# Patient Record
Sex: Female | Born: 1935 | Race: Black or African American | Hispanic: No | Smoking: Former smoker
Health system: Southern US, Community
[De-identification: ages and names within clinical notes are randomized; demographics above are authoritative.]

## PROBLEM LIST (undated history)

## (undated) DIAGNOSIS — M810 Age-related osteoporosis without current pathological fracture: Secondary | ICD-10-CM

## (undated) DIAGNOSIS — B019 Varicella without complication: Secondary | ICD-10-CM

## (undated) DIAGNOSIS — Z8679 Personal history of other diseases of the circulatory system: Secondary | ICD-10-CM

## (undated) DIAGNOSIS — M1990 Unspecified osteoarthritis, unspecified site: Secondary | ICD-10-CM

## (undated) DIAGNOSIS — N189 Chronic kidney disease, unspecified: Secondary | ICD-10-CM

## (undated) DIAGNOSIS — I10 Essential (primary) hypertension: Secondary | ICD-10-CM

## (undated) DIAGNOSIS — J8410 Pulmonary fibrosis, unspecified: Secondary | ICD-10-CM

## (undated) HISTORY — PX: BREAST SURGERY: SHX581

## (undated) HISTORY — DX: Chronic kidney disease, unspecified: N18.9

## (undated) HISTORY — DX: Personal history of other diseases of the circulatory system: Z86.79

## (undated) HISTORY — PX: TOTAL HIP ARTHROPLASTY: SHX124

## (undated) HISTORY — DX: Unspecified osteoarthritis, unspecified site: M19.90

## (undated) HISTORY — DX: Varicella without complication: B01.9

---

## 2016-01-02 ENCOUNTER — Emergency Department: Payer: Medicare Other

## 2016-01-02 ENCOUNTER — Inpatient Hospital Stay
Admission: EM | Admit: 2016-01-02 | Discharge: 2016-01-04 | DRG: 190 | Disposition: A | Discharge status: Home Health | Payer: Medicare Other | Attending: Internal Medicine | Admitting: Internal Medicine

## 2016-01-02 ENCOUNTER — Inpatient Hospital Stay: Payer: Medicare Other

## 2016-01-02 ENCOUNTER — Encounter: Payer: Self-pay | Admitting: Emergency Medicine

## 2016-01-02 DIAGNOSIS — Z7982 Long term (current) use of aspirin: Secondary | ICD-10-CM | POA: Diagnosis not present

## 2016-01-02 DIAGNOSIS — J9621 Acute and chronic respiratory failure with hypoxia: Secondary | ICD-10-CM | POA: Diagnosis present

## 2016-01-02 DIAGNOSIS — J9620 Acute and chronic respiratory failure, unspecified whether with hypoxia or hypercapnia: Secondary | ICD-10-CM | POA: Diagnosis present

## 2016-01-02 DIAGNOSIS — J440 Chronic obstructive pulmonary disease with acute lower respiratory infection: Principal | ICD-10-CM | POA: Diagnosis present

## 2016-01-02 DIAGNOSIS — E876 Hypokalemia: Secondary | ICD-10-CM | POA: Diagnosis present

## 2016-01-02 DIAGNOSIS — J189 Pneumonia, unspecified organism: Secondary | ICD-10-CM

## 2016-01-02 DIAGNOSIS — Z87891 Personal history of nicotine dependence: Secondary | ICD-10-CM | POA: Diagnosis not present

## 2016-01-02 DIAGNOSIS — M810 Age-related osteoporosis without current pathological fracture: Secondary | ICD-10-CM | POA: Diagnosis present

## 2016-01-02 DIAGNOSIS — Z79899 Other long term (current) drug therapy: Secondary | ICD-10-CM | POA: Diagnosis not present

## 2016-01-02 DIAGNOSIS — J180 Bronchopneumonia, unspecified organism: Secondary | ICD-10-CM | POA: Diagnosis present

## 2016-01-02 DIAGNOSIS — J8410 Pulmonary fibrosis, unspecified: Secondary | ICD-10-CM | POA: Diagnosis present

## 2016-01-02 DIAGNOSIS — I10 Essential (primary) hypertension: Secondary | ICD-10-CM | POA: Diagnosis present

## 2016-01-02 DIAGNOSIS — R0902 Hypoxemia: Secondary | ICD-10-CM

## 2016-01-02 HISTORY — DX: Age-related osteoporosis without current pathological fracture: M81.0

## 2016-01-02 HISTORY — DX: Essential (primary) hypertension: I10

## 2016-01-02 HISTORY — DX: Pulmonary fibrosis, unspecified: J84.10

## 2016-01-02 LAB — BASIC METABOLIC PANEL
Anion gap: 9 (ref 5–15)
BUN: 25 mg/dL — AB (ref 6–20)
CHLORIDE: 103 mmol/L (ref 101–111)
CO2: 25 mmol/L (ref 22–32)
Calcium: 10.1 mg/dL (ref 8.9–10.3)
Creatinine, Ser: 1.16 mg/dL — ABNORMAL HIGH (ref 0.44–1.00)
GFR calc Af Amer: 51 mL/min — ABNORMAL LOW (ref >60)
GFR calc non Af Amer: 44 mL/min — ABNORMAL LOW (ref >60)
GLUCOSE: 95 mg/dL (ref 65–99)
POTASSIUM: 3.4 mmol/L — AB (ref 3.5–5.1)
Sodium: 137 mmol/L (ref 135–145)

## 2016-01-02 LAB — CBC
HEMATOCRIT: 40.9 % (ref 35.0–47.0)
HEMOGLOBIN: 13.7 g/dL (ref 12.0–16.0)
MCH: 27.6 pg (ref 26.0–34.0)
MCHC: 33.5 g/dL (ref 32.0–36.0)
MCV: 82.6 fL (ref 80.0–100.0)
Platelets: 321 10*3/uL (ref 150–440)
RBC: 4.96 MIL/uL (ref 3.80–5.20)
RDW: 15.3 % — ABNORMAL HIGH (ref 11.5–14.5)
WBC: 8.2 10*3/uL (ref 3.6–11.0)

## 2016-01-02 LAB — TROPONIN I: Troponin I: <0.03 ng/mL (ref <0.03)

## 2016-01-02 LAB — BRAIN NATRIURETIC PEPTIDE: B Natriuretic Peptide: 126 pg/mL — ABNORMAL HIGH (ref 0.0–100.0)

## 2016-01-02 LAB — MAGNESIUM: MAGNESIUM: 1.6 mg/dL — AB (ref 1.7–2.4)

## 2016-01-02 MED ORDER — ONDANSETRON HCL 4 MG/2ML IJ SOLN: 4.0000 mg | Freq: Four times a day (QID) | INTRAMUSCULAR | Status: DC | PRN

## 2016-01-02 MED ORDER — ENOXAPARIN SODIUM 40 MG/0.4ML ~~LOC~~ SOLN: 40.0000 mg | SUBCUTANEOUS | Status: DC

## 2016-01-02 MED ORDER — ACETAMINOPHEN 650 MG RE SUPP: 650.0000 mg | Freq: Four times a day (QID) | RECTAL | Status: DC | PRN

## 2016-01-02 MED ORDER — ACETAMINOPHEN 500 MG PO TABS
1000.0000 mg | ORAL_TABLET | Freq: Once | ORAL | Status: AC
  Administered 2016-01-02: 1000 mg via ORAL

## 2016-01-02 MED ORDER — IPRATROPIUM-ALBUTEROL 0.5-2.5 (3) MG/3ML IN SOLN
3.0000 mL | Freq: Once | RESPIRATORY_TRACT | Status: AC
  Administered 2016-01-02: 3 mL via RESPIRATORY_TRACT
  Filled 2016-01-02: qty 3

## 2016-01-02 MED ORDER — POTASSIUM CHLORIDE CRYS ER 20 MEQ PO TBCR
20.0000 meq | EXTENDED_RELEASE_TABLET | Freq: Two times a day (BID) | ORAL | Status: DC
  Administered 2016-01-02: 22:00:00 20 meq via ORAL
  Filled 2016-01-02: qty 1

## 2016-01-02 MED ORDER — ACETAMINOPHEN 500 MG PO TABS
ORAL_TABLET | ORAL | Status: AC
  Administered 2016-01-02: 1000 mg via ORAL
  Filled 2016-01-02: qty 2

## 2016-01-02 MED ORDER — METHYLPREDNISOLONE SODIUM SUCC 125 MG IJ SOLR
62.5000 mg | Freq: Once | INTRAMUSCULAR | Status: AC
  Administered 2016-01-02: 62.5 mg via INTRAVENOUS
  Filled 2016-01-02: qty 2

## 2016-01-02 MED ORDER — LEVOFLOXACIN IN D5W 750 MG/150ML IV SOLN
750.0000 mg | Freq: Once | INTRAVENOUS | Status: AC
  Administered 2016-01-02: 750 mg via INTRAVENOUS
  Filled 2016-01-02: qty 150

## 2016-01-02 MED ORDER — ENOXAPARIN SODIUM 30 MG/0.3ML ~~LOC~~ SOLN
30.0000 mg | SUBCUTANEOUS | Status: DC
  Administered 2016-01-02 – 2016-01-03 (×2): 30 mg via SUBCUTANEOUS
  Filled 2016-01-02 (×2): qty 0.3

## 2016-01-02 MED ORDER — IPRATROPIUM-ALBUTEROL 0.5-2.5 (3) MG/3ML IN SOLN
RESPIRATORY_TRACT | Status: AC
  Administered 2016-01-02: 20:00:00 3 mL
  Filled 2016-01-02: qty 3

## 2016-01-02 MED ORDER — IPRATROPIUM-ALBUTEROL 0.5-2.5 (3) MG/3ML IN SOLN
3.0000 mL | Freq: Four times a day (QID) | RESPIRATORY_TRACT | Status: DC
  Administered 2016-01-03 – 2016-01-04 (×6): 3 mL via RESPIRATORY_TRACT
  Filled 2016-01-02 (×6): qty 3

## 2016-01-02 MED ORDER — METHYLPREDNISOLONE SODIUM SUCC 40 MG IJ SOLR
40.0000 mg | Freq: Two times a day (BID) | INTRAMUSCULAR | Status: DC
  Administered 2016-01-03 – 2016-01-04 (×3): 40 mg via INTRAVENOUS
  Filled 2016-01-02 (×3): qty 1

## 2016-01-02 MED ORDER — ONDANSETRON HCL 4 MG PO TABS: 4.0000 mg | ORAL_TABLET | Freq: Four times a day (QID) | ORAL | Status: DC | PRN

## 2016-01-02 MED ORDER — ASPIRIN EC 81 MG PO TBEC
81.0000 mg | DELAYED_RELEASE_TABLET | Freq: Every day | ORAL | Status: DC
  Administered 2016-01-02 – 2016-01-04 (×3): 81 mg via ORAL
  Filled 2016-01-02 (×3): qty 1

## 2016-01-02 MED ORDER — LEVOFLOXACIN IN D5W 750 MG/150ML IV SOLN: 750.0000 mg | INTRAVENOUS | Status: DC

## 2016-01-02 MED ORDER — ACETAMINOPHEN 325 MG PO TABS
650.0000 mg | ORAL_TABLET | Freq: Four times a day (QID) | ORAL | Status: DC | PRN
  Administered 2016-01-02 – 2016-01-04 (×4): 650 mg via ORAL
  Filled 2016-01-02 (×4): qty 2

## 2016-01-02 NOTE — Progress Notes (Signed)
Anticoagulation monitoring(Lovenox):  81 yo  ordered Lovenox 40 mg Q24h  Filed Weights   01/02/16 1544  Weight: 102 lb (46.267 kg)   Per pt thru RN, pt ht 5' CrCl ~ 29 ml/min  Lab Results  Component Value Date   CREATININE 1.16* 01/02/2016   CrCl cannot be calculated (Unknown ideal weight.). Hemoglobin & Hematocrit     Component Value Date/Time   HGB 13.7 01/02/2016 1549   HCT 40.9 01/02/2016 1549     Per Protocol for Patient with estCrcl< 30 ml/min and BMI < 40, will transition to Lovenox 30 mg Q24h.

## 2016-01-02 NOTE — Progress Notes (Signed)
Received a newly admitted Patient from ED, came to the unit per stretcher, escorted by ED NT. Patient alert and coherent, not in any form of distress. Came in due to Shortness of breath, with an admitting diagnosis of COPD, Bronchopneumonia. Routine admission care provided. Safety measures in place. Initial assessment completed.  Orientation to room done.

## 2016-01-02 NOTE — H&P (Signed)
[facility] [place] [facility]   PATIENT NAME: Rosana Bergerheresa Saunders    MR#:  [id number]  DATE OF BIRTH:  27-Jul-1934  DATE OF ADMISSION:  01/02/2016  PRIMARY CARE PHYSICIAN: No PCP Per Patient Non-local  REQUESTING/REFERRING PHYSICIAN: Dr. Minna AntisKevin Paduchowski  CHIEF COMPLAINT:   Chief Complaint  Patient presents with  . Shortness of Breath  . Chest Pain    HISTORY OF PRESENT ILLNESS:  Rosana Bergerheresa Saunders  is a 81 y.o. female with a known history of COPD, hypertension, osteoporosis, pulmonary fibrosis who presents to the hospital due to shortness of breath. Patient just moved from [place] a few weeks back and has not established care with a physician here in this area. She has been feeling short of breath now for the past to 3 weeks progressively getting worse. She is not on oxygen at home. She admits to a cough which is productive of yellow sputum but denies any fevers, chills, nausea, vomiting, abdominal pain or any other associated symptoms. She presented to the emergency room was noted to be in acute respiratory failure with hypoxia and hospitalist services were contacted further treatment and evaluation.  PAST MEDICAL HISTORY:   Past Medical History  Diagnosis Date  . COPD (chronic obstructive pulmonary disease) (HCC)   . Hypertension   . Osteoporosis   . Renal disorder     PAST SURGICAL HISTORY:   Past Surgical History  Procedure Laterality Date  . Breast surgery    . Total hip arthroplasty      SOCIAL HISTORY:   Social History  Substance Use Topics  . Smoking status: Former Games developermoker  . Smokeless tobacco: Not on file  . Alcohol Use: No    FAMILY HISTORY:  No family history on file.  DRUG ALLERGIES:  No Known Allergies  REVIEW OF SYSTEMS:   Review of Systems  Constitutional: Negative for fever and weight loss.  HENT: Negative for congestion, nosebleeds and tinnitus.   Eyes: Negative for blurred vision, double vision and redness.   Respiratory: Positive for cough, sputum production and shortness of breath. Negative for hemoptysis.   Cardiovascular: Negative for chest pain, orthopnea, leg swelling and PND.  Gastrointestinal: Negative for nausea, vomiting, abdominal pain, diarrhea and melena.  Genitourinary: Negative for dysuria, urgency and hematuria.  Musculoskeletal: Negative for joint pain and falls.  Neurological: Negative for dizziness, tingling, sensory change, focal weakness, seizures, weakness and headaches.  Endo/Heme/Allergies: Negative for polydipsia. Does not bruise/bleed easily.  Psychiatric/Behavioral: Negative for depression and memory loss. The patient is not nervous/anxious.     MEDICATIONS AT HOME:   Prior to Admission medications   Medication Sig Start Date End Date Taking? Authorizing Provider  aspirin EC 81 MG tablet Take 81 mg by mouth daily.   Yes Historical Provider, MD      VITAL SIGNS:  Blood pressure 132/70, pulse 94, temperature 98.1 F (36.7 C), temperature source Oral, resp. rate 38, weight 46.267 kg (102 lb), SpO2 100 %.  PHYSICAL EXAMINATION:  Physical Exam  GENERAL:  81 y.o.-year-old cachectic patient lying in the bed in no acute distress.  EYES: Pupils equal, round, reactive to light and accommodation. No scleral icterus. Extraocular muscles intact.  HEENT: Head atraumatic, normocephalic. Oropharynx and nasopharynx clear. No oropharyngeal erythema, moist oral mucosa  NECK:  Supple, no jugular venous distention. No thyroid enlargement, no tenderness.  LUNGS: Normal breath sounds bilaterally, diffuse dry Crackles midway up the lung fields, no rhonchi, wheezing. No use of accessory muscles of respiration.  CARDIOVASCULAR: S1, S2 RRR. No murmurs, rubs, gallops, clicks.  ABDOMEN: Soft, nontender, nondistended. Bowel sounds present. No organomegaly or mass.  EXTREMITIES: No pedal edema, cyanosis, or clubbing. + 2 pedal & radial pulses b/l.   NEUROLOGIC: Cranial nerves II through XII  are intact. No focal Motor or sensory deficits appreciated b/l PSYCHIATRIC: The patient is alert and oriented x 3. Good affect.  SKIN: No obvious rash, lesion, or ulcer.   LABORATORY PANEL:   CBC  Recent Labs Lab 01/02/16 1549  WBC 8.2  HGB 13.7  HCT 40.9  PLT 321   ------------------------------------------------------------------------------------------------------------------  Chemistries   Recent Labs Lab 01/02/16 1549  NA 137  K 3.4*  CL 103  CO2 25  GLUCOSE 95  BUN 25*  CREATININE 1.16*  CALCIUM 10.1   ------------------------------------------------------------------------------------------------------------------  Cardiac Enzymes  Recent Labs Lab 01/02/16 1549  TROPONINI <0.03   ------------------------------------------------------------------------------------------------------------------  RADIOLOGY:  Dg Chest 2 View  01/02/2016  CLINICAL DATA:  Shortness of breath over the last month, worsening with exertion. COPD. EXAM: CHEST  2 VIEW COMPARISON:  None. FINDINGS: Heart size is at the upper limits of normal. Mediastinal shadows are normal. There is diffuse fibrosis throughout the lungs bilaterally. More focal density in the right upper lobe suggests segmental pneumonia no collapse. No effusion. No acute bone finding. IMPRESSION: Advanced pulmonary fibrosis pattern diffusely. Region of focal density in the right upper lobe consistent with segmental bronchopneumonia. Electronically Signed   By: Paulina FusiMark  Shogry M.D.   On: 01/02/2016 16:53     IMPRESSION AND PLAN:   81 year old female with past medical history of COPD, pulmonary fibrosis who presents to the hospital due to shortness of breath.  1. Acute on chronic respiratory failure with hypoxia-secondary to pulmonary fibrosis along with underlying pneumonia as seen on chest x-ray. -Continue O2 supplementation, continue IV Levaquin for treatment of underlying pneumonia. -Patient will need to be assessed for  home oxygen prior to discharge.  2. Pulmonary fibrosis with exacerbation-secondary to underlying pneumonia as mentioned on a chest x-ray. -We'll start the patient on IV steroids, scheduled DuoNeb's, empiric Levaquin -We'll get a CT chest noncontrast, pulmonary consult.  3. Pneumonia - CAP.  - will place on IV Levaquin and follow cultures  4. Hypokalemia-we'll place on oral potassium supplements. -Check magnesium level.    All the records are reviewed and case discussed with ED provider. Management plans discussed with the patient, family and they are in agreement.  CODE STATUS: Full  TOTAL TIME TAKING CARE OF THIS PATIENT: 45 minutes.    Houston SirenSAINANI,VIVEK J M.D on 01/02/2016 at 7:18 PM  Between 7am to 6pm - Pager - [phone]  After 6pm go to [email or web address] - password [email or web address] [facility]  [facility]  Office  [phone]  CC: Primary care physician; No PCP Per Patient

## 2016-01-02 NOTE — ED Notes (Signed)
Pt presents to ED with reports of shortness of breath for over one month that increases when she walks. Pt speaking in halted sentences.

## 2016-01-02 NOTE — ED Notes (Signed)
Report attempted to be called, RN unavaliable, left name and number for nurse to call back.

## 2016-01-02 NOTE — Progress Notes (Signed)
Pharmacy Antibiotic Note  Julia Huffman is a 81 y.o. female admitted on 01/02/2016 with pneumonia.  Pharmacy has been consulted for levofloxacin dosing.  Plan: Pt received levofloxacin 750 mg IV x1 in ED  Per pt thru RN, pt ht 5' CrCl ~ 29 ml/min Will continue dosing with levofloxacin 750 mg IV q48h   Weight: 102 lb (46.267 kg)  Temp (24hrs), Avg:98.1 F (36.7 C), Min:98 F (36.7 C), Max:98.1 F (36.7 C)   Recent Labs Lab 01/02/16 1549  WBC 8.2  CREATININE 1.16*    CrCl cannot be calculated (Unknown ideal weight.).    No Known Allergies  Antimicrobials this admission: Levofloxacin 7/6 >>   Dose adjustments this admission:   Microbiology results: 7/6 BCx:    Thank you for allowing pharmacy to be a part of this patient's care.  Marty HeckWang, Michelangelo Rindfleisch L 01/02/2016 8:28 PM

## 2016-01-02 NOTE — ED Notes (Signed)
Patient completed breathing treatment.  Radiology called and informed she is ready for chest x-ray.

## 2016-01-02 NOTE — ED Provider Notes (Signed)
[facility] Emergency Department Provider Note  Time seen: 3:53 PM  I have reviewed the triage vital signs and the nursing notes.   HISTORY  Chief Complaint Shortness of Breath and Chest Pain    HPI Julia Huffman is a 81 y.o. female with a past medical history of CK D, hypertension, COPD who presents the emergency department with difficulty breathing. The patient recently moved to the area, living with her daughter. The daughter states the patient does not take any medications chronically although diagnosed with hypertension and COPD. The daughter also states the patient has lost a significant amount of weight over the past several months for no known reason. The patient states her shortness of breath is much worse with exertion. Denies any chest pain. States she'll become very short of breath episode down and rest for several minutes before she can go on walking. Denies any leg pain or swelling. Denies abdominal pain. Denies fever, cough or congestion. She states her shortness of breath has been ongoing for the past 1 month getting progressively worse however over the past few days it has become acutely worse.     Past Medical History  Diagnosis Date  . COPD (chronic obstructive pulmonary disease) (HCC)   . Hypertension   . Osteoporosis   . Renal disorder     There are no active problems to display for this patient.   Past Surgical History  Procedure Laterality Date  . Breast surgery    . Total hip arthroplasty      No current outpatient prescriptions on file.  Allergies Review of patient's allergies indicates no known allergies.  No family history on file.  Social History Social History  Substance Use Topics  . Smoking status: Former Games developermoker  . Smokeless tobacco: None  . Alcohol Use: No    Review of Systems Constitutional: Negative for fever. Cardiovascular: Negative for chest pain. Respiratory: Significant shortness of breath  especially with exertion. Gastrointestinal: Negative for abdominal pain Genitourinary: Negative for dysuria Neurological: Negative for headache 10-point ROS otherwise negative.  ____________________________________________   PHYSICAL EXAM:  VITAL SIGNS: ED Triage Vitals  Enc Vitals Group     BP 01/02/16 1544 166/115 mmHg     Pulse Rate 01/02/16 1544 112     Resp 01/02/16 1544 20     Temp 01/02/16 1544 98.1 F (36.7 C)     Temp Source 01/02/16 1544 Oral     SpO2 01/02/16 1544 93 %     Weight 01/02/16 1544 102 lb (46.267 kg)     Height --      Head Cir --      Peak Flow --      Pain Score --      Pain Loc --      Pain Edu? --      Excl. in GC? --     Constitutional: Alert and oriented. Well appearing and in no distress. Eyes: Normal exam ENT   Head: Normocephalic and atraumatic.   Mouth/Throat: Mucous membranes are moist. Cardiovascular: Normal rate, regular rhythm. No murmur Respiratory: Patient speaks in 1-2 word sentences due to shortness of breath. Lungs have moderate expiratory wheeze bilaterally. No rales or rhonchi. Gastrointestinal: Soft and nontender. No distention. Musculoskeletal: Nontender with normal range of motion in all extremities. No lower extremity tenderness or edema. Neurologic:  Normal speech and language. No gross focal neurologic deficits Skin:  Skin is warm, dry and intact.  Psychiatric: Mood and affect are normal.  ____________________________________________    EKG  EKG reviewed and interpreted by myself shows sinus tachycardia 110 bpm, narrow QRS, normal axis, largely normal intervals with nonspecific ST changes. No ST elevation.  ____________________________________________    RADIOLOGY  Chest x-ray shows right upper lobe pneumonia superimposed upon pulmonary fibrosis.  ____________________________________________   INITIAL IMPRESSION / ASSESSMENT AND PLAN / ED COURSE  Pertinent labs & imaging results that were available  during my care of the patient were reviewed by me and considered in my medical decision making (see chart for details).  Patient presents to the emergency department for shortness breath. She states shortness breath has been ongoing for 1 month, worse over the past few days. Patient does have moderate expiratory wheeze bilaterally with room air oxygen saturation of 86%. She does not wear oxygen at home, does not take medications at home. We will start the patient on 2 L nasal cannula, obtain labs, dose Solu-Medrol as well as DuoNeb nebs and obtain a chest x-ray. Patient is in no distress at rest but can only speak in 2-3 word sentences due to shortness of breath.  X-ray consistent with pneumonia superimposed upon pulmonary fibrosis. Patient is hypoxic on room air, feeling better after breathing treatments and Solu-Medrol. Given her hypoxia, with pneumonia we will treat with IV in about X admit to the hospital.  ____________________________________________   FINAL CLINICAL IMPRESSION(S) / ED DIAGNOSES  Dyspnea COPD exacerbation Pneumonia  Minna AntisKevin Tobby Fawcett, MD 01/02/16 [phone]

## 2016-01-03 DIAGNOSIS — J8410 Pulmonary fibrosis, unspecified: Secondary | ICD-10-CM

## 2016-01-03 DIAGNOSIS — J9620 Acute and chronic respiratory failure, unspecified whether with hypoxia or hypercapnia: Secondary | ICD-10-CM

## 2016-01-03 LAB — BASIC METABOLIC PANEL
ANION GAP: 7 (ref 5–15)
BUN: 25 mg/dL — ABNORMAL HIGH (ref 6–20)
CHLORIDE: 108 mmol/L (ref 101–111)
CO2: 24 mmol/L (ref 22–32)
Calcium: 9.3 mg/dL (ref 8.9–10.3)
Creatinine, Ser: 1.14 mg/dL — ABNORMAL HIGH (ref 0.44–1.00)
GFR calc non Af Amer: 45 mL/min — ABNORMAL LOW (ref >60)
GFR, EST AFRICAN AMERICAN: 52 mL/min — AB (ref >60)
Glucose, Bld: 140 mg/dL — ABNORMAL HIGH (ref 65–99)
POTASSIUM: 4.1 mmol/L (ref 3.5–5.1)
Sodium: 139 mmol/L (ref 135–145)

## 2016-01-03 LAB — CBC
HEMATOCRIT: 36.6 % (ref 35.0–47.0)
HEMOGLOBIN: 12.1 g/dL (ref 12.0–16.0)
MCH: 28 pg (ref 26.0–34.0)
MCHC: 33 g/dL (ref 32.0–36.0)
MCV: 84.9 fL (ref 80.0–100.0)
Platelets: 274 10*3/uL (ref 150–440)
RBC: 4.32 MIL/uL (ref 3.80–5.20)
RDW: 14.6 % — ABNORMAL HIGH (ref 11.5–14.5)
WBC: 4.1 10*3/uL (ref 3.6–11.0)

## 2016-01-03 MED ORDER — CEFUROXIME AXETIL 500 MG PO TABS
250.0000 mg | ORAL_TABLET | Freq: Two times a day (BID) | ORAL | Status: DC
  Administered 2016-01-03 – 2016-01-04 (×2): 250 mg via ORAL
  Filled 2016-01-03 (×2): qty 1

## 2016-01-03 MED ORDER — AZITHROMYCIN 250 MG PO TABS: 250.0000 mg | ORAL_TABLET | Freq: Every day | ORAL | Status: DC

## 2016-01-03 MED ORDER — AZITHROMYCIN 250 MG PO TABS
500.0000 mg | ORAL_TABLET | Freq: Once | ORAL | Status: AC
  Administered 2016-01-03: 17:00:00 500 mg via ORAL
  Filled 2016-01-03: qty 2

## 2016-01-03 NOTE — Clinical Social Work Note (Signed)
CSW consulted for PCP. CSW discussed with RNCM. RNCM has addressed this. CSW is signing off as no further needs identified.   Dede QuerySarah Reymundo Winship, MSW, LCSW Clinical Social Worker [phone]

## 2016-01-03 NOTE — Care Management (Signed)
Admitted to [facility] with the diagnosis of acute/chronic respiratory failure. Lives with daughter Mikeal HawthorneRoxianne. Daughter Adela LankJacqueline [phone]). Moved here from [place]. Last seen Dr. Vita BarleyHuntley in [place] 4 months ago. No primary care physician here, gave list of physicians accepting new patients. States she has had home health in [place] 5 months ago, doesn't remember name of agency. No skilled nursing. No home oxygen. Fell in the bath tub just prior to this move. Rolling walker, bedside commode, and raised toilet seat in the home. States she can feed and dress herself, but needs helps with baths. States she does have a drug store near her home that she uses, but can't remember name of store. Fair appetite. Doesn't drive. Family will transport. Gwenette GreetBrenda S Antoinette Borgwardt RN MSN CCM Care Management [phone]

## 2016-01-03 NOTE — Progress Notes (Signed)
[employer] [facility] - [place] at [facility]   PATIENT NAME: Julia Huffman    MR#:  [id number]  DATE OF BIRTH:  May 21, 1935  SUBJECTIVE:   Patient reports shortness of breath is improved.  REVIEW OF SYSTEMS:    Review of Systems  Constitutional: Negative for fever, chills and malaise/fatigue.  HENT: Negative for ear discharge, ear pain, hearing loss, nosebleeds and sore throat.   Eyes: Negative for blurred vision and pain.  Respiratory: Positive for shortness of breath. Negative for cough, hemoptysis and wheezing.   Cardiovascular: Negative for chest pain, palpitations and leg swelling.  Gastrointestinal: Negative for nausea, vomiting, abdominal pain, diarrhea and blood in stool.  Genitourinary: Negative for dysuria.  Musculoskeletal: Negative for back pain.  Neurological: Negative for dizziness, tremors, speech change, focal weakness, seizures and headaches.  Endo/Heme/Allergies: Does not bruise/bleed easily.  Psychiatric/Behavioral: Negative for depression, suicidal ideas and hallucinations.    Tolerating Diet:yes      DRUG ALLERGIES:  No Known Allergies  VITALS:  Blood pressure 137/74, pulse 92, temperature 97.4 F (36.3 C), temperature source Oral, resp. rate 16, height 5' (1.524 m), weight 46.267 kg (102 lb), SpO2 96 %.  PHYSICAL EXAMINATION:   Physical Exam    LABORATORY PANEL:   CBC  Recent Labs Lab 01/03/16 0500  WBC 4.1  HGB 12.1  HCT 36.6  PLT 274   ------------------------------------------------------------------------------------------------------------------  Chemistries   Recent Labs Lab 01/02/16 1549 01/03/16 0500  NA 137 139  K 3.4* 4.1  CL 103 108  CO2 25 24  GLUCOSE 95 140*  BUN 25* 25*  CREATININE 1.16* 1.14*  CALCIUM 10.1 9.3  MG 1.6*  --    ------------------------------------------------------------------------------------------------------------------  Cardiac Enzymes  Recent Labs Lab 01/02/16 1549   TROPONINI <0.03   ------------------------------------------------------------------------------------------------------------------  RADIOLOGY:  Dg Chest 2 View  01/02/2016  CLINICAL DATA:  Shortness of breath over the last month, worsening with exertion. COPD. EXAM: CHEST  2 VIEW COMPARISON:  None. FINDINGS: Heart size is at the upper limits of normal. Mediastinal shadows are normal. There is diffuse fibrosis throughout the lungs bilaterally. More focal density in the right upper lobe suggests segmental pneumonia no collapse. No effusion. No acute bone finding. IMPRESSION: Advanced pulmonary fibrosis pattern diffusely. Region of focal density in the right upper lobe consistent with segmental bronchopneumonia. Electronically Signed   By: Paulina FusiMark  Shogry M.D.   On: 01/02/2016 16:53   Ct Chest Wo Contrast  01/02/2016  CLINICAL DATA:  Difficulty breathing, abnormal chest radiograph with pulmonary fibrosis, hypertension, COPD, weight loss, increased shortness of breath with exertion, former smoker EXAM: CT CHEST WITHOUT CONTRAST TECHNIQUE: Multidetector CT imaging of the chest was performed following the standard protocol without IV contrast. Sagittal and coronal MPR images reconstructed from axial data set. COMPARISON:  Chest radiograph 01/02/2016 FINDINGS: Cardiovascular: Atherosclerotic calcifications aorta, proximal great vessels and coronary arteries. No pericardial effusion. Heart normal size. Mediastinum/Nodes: Base of cervical region unremarkable. Dilated air-filled esophagus proximally, fluid-filled distally. No definite adenopathy. Lungs/Pleura: Severe honeycomb formation in both lower lobes at the periphery of the lingula and RIGHT middle lobe. Scattered mile subpleural honeycomb formation in the upper lobes bilaterally. Scattered areas of parenchymal scarring and interstitial thickening. No pneumothorax or pleural effusion. Multiple areas of parenchymal opacity are somewhat nodular in appearance  without dominant mass. Nodular foci 8 x 6 mm laterally LEFT upper lobe image 16 and centrally RIGHT upper lobe 12 x 7 mm image 21. No significant bronchiectasis. Upper Abdomen: Small cyst upper to mid LEFT  kidney 14 x 14 mm image 55. Aortic atherosclerotic calcifications. Remaining visualized upper abdomen unremarkable. Musculoskeletal: Bones demineralized.  No acute osseous findings. IMPRESSION: Severe end-stage lung disease with extensive honeycomb formation throughout BILATERAL lower lobes and to lesser degree within remaining lobes peripherally. Few areas of slight nodularity are identified without dominant mass, recommend attention on follow-up imaging, recommendation below. Recommend follow-up noncontrast CT chest in 3 months to reassess these nodules. Aortic atherosclerosis. Electronically Signed   By: Ulyses SouthwardMark  Boles M.D.   On: 01/02/2016 19:45     ASSESSMENT AND PLAN:   81 year old female with history of pulmonary fibrosis who presents with acute hypoxic respiratory failure.   1. Acute hypoxic respiratory failure: This is due to progressive severe pulmonary fibrosis along with community-acquired pneumonia. Continue Levaquin and IV steroids. Pulmonary consult pending. Wean oxygen as tolerated. 2. Severe pulmonary fibrosis: Follow up on pulmonary consult.  3. Community acquired pneumonia: Continue Levaquin and wean oxygen as tolerated. Blood cultures are negative.  4. History of hypertension: Patient currently not on medications. Continue to monitor blood pressure and need to start BP meds.  PT consult for disposition.  Management plans discussed with the patient and she is in agreement.  CODE STATUS: full  TOTAL TIME TAKING CARE OF THIS PATIENT: 30 minutes.     POSSIBLE D/C 1-2 days, DEPENDING ON CLINICAL CONDITION.   Adalin Vanderploeg M.D on 01/03/2016 at 11:17 AM  Between 7am to 6pm - Pager - [phone] After 6pm go to [email or web address] - Social research officer, governmentpassword [email or web address]  [facility] [place] [place]  [facility]  Office  [phone]  CC: Primary care physician; No PCP Per Patient  Note: This dictation was prepared with Dragon dictation along with smaller phrase technology. Any transcriptional errors that result from this process are unintentional.

## 2016-01-03 NOTE — Evaluation (Signed)
Physical Therapy Evaluation Patient Details Name: Julia Huffman MRN: [id number] DOB: January 21, 1935 Today's Date: 01/03/2016   History of Present Illness  Pt is a 81 yr old female presenting with a month long history of worsening SOB. Pt admitted with acute respiratory failure 2/2 pulmonary fibrosis on top of pneumonia. PMH significant for COPD, HTN and osteoporosis.    Clinical Impression  Prior to admission, pt was mod I with 4-wheel walker, but only ambulating 10-4215ft before requiring seated rest d/t SOB. Pt requires assist from her daughter for ADLs of bathing and dressing.  Pt lives with her daughter in an entry level 2-story home, though she is able to live on the main level and does not go upstairs.   Currently, pt is mod I for bed mobility, supervision for sit <> stand transfers, and min guard for ambulation 2 x 653ft with RW.  Pt presents with dyspnea on exertion, desatting to 87% following ambulatory transfer BSC > chair (quickly recovered to 94%) on 2L O2.  From a mobility standpoint, it appears pt may be close to her baseline, limited most from a pulmonary standpoint. While pt is admitted, we will continue to follow with skilled acute PT to address activity tolerance and increase OOB activity.  Recommend pt discharge home with use of 4-wheel walker and intermittent assist from family when medically appropriate.     Follow Up Recommendations No PT follow up;Supervision - Intermittent    Equipment Recommendations   (pt has 4WW at home)    Recommendations for Other Services       Precautions / Restrictions Restrictions Weight Bearing Restrictions: No      Mobility  Bed Mobility Overal bed mobility: Modified Independent General bed mobility comments: [facility] ~[facility] degrees and use of bed rails, pt achieves supine <> sit mod I.   Transfers Overall transfer level: Needs assistance Equipment used: Rolling walker (2 wheeled) Transfers: Sit to/from Stand Sit to Stand:  Supervision General transfer comment: Pt completed 3 sit <> stand transfers with RW and supervision. Vc's for hand/foot placement and safety, no physical assist required.  Ambulation/Gait Ambulation/Gait assistance: Min guard Ambulation Distance (Feet):  (2 x 753ft) Assistive device: Rolling walker (2 wheeled) Gait Pattern/deviations: WFL(Within Functional Limits);Step-through pattern General Gait Details: Pt completed 2 ambulatory transfers x 723ft, bed > BSC and BSC > chair. Vc's and CGA for safety and DME use. Desaturated to 87% on 2L O2, was able to quickly recover to 94%.  Stairs    Wheelchair Mobility    Modified Rankin (Stroke Patients Only)       Balance Overall balance assessment: Needs assistance Sitting-balance support: Feet supported Sitting balance-Leahy Scale: Good     Standing balance support: Bilateral upper extremity supported (on RW) Standing balance-Leahy Scale: Good     Pertinent Vitals/Pain Pain Assessment: No/denies pain  HR monitored throughout session and maintained 96-111 bpm. O2 saturations as follows: 89% upon room entry - pt not wearing nasal cannula - recovers to 96% with 2L O2 96% on 2L O2 following transfer bed > BSC  87% on 2L O2 following transfer BSC > chair; recovered to 94% within ~45 seconds 98% on 2L O2 at end of session    Home Living Family/patient expects to be discharged to:: Private residence Living Arrangements: Children Available Help at Discharge: Family (daughter) Type of Home: House Home Access: Level entry Home Layout: Two level;Able to live on main level with bedroom/bathroom Home Equipment: Walker - 4 wheels;Bedside commode;Grab bars - toilet Additional  Comments: Pt slipped and fell in the shower several months ago, and has been taking sink baths since.     Prior Function Level of Independence: Independent with assistive device(s)  Comments: Pt uses rollator walker to ambulate short distances (sounds like 10-15ft), [id number]  to take a seated rest, and continues with this pattern until she reaches her destination; home is set up for this.        Extremity/Trunk Assessment   Upper Extremity Assessment: Overall WFL for tasks assessed   Lower Extremity Assessment: Overall WFL for tasks assessed  Cervical / Trunk Assessment: Normal    Communication    No difficulties  Cognition Arousal/Alertness: Awake/alert Behavior During Therapy: WFL for tasks assessed/performed Overall Cognitive Status: Within Functional Limits for tasks assessed    General Comments Pt had BM during my evaluation, RN notified.     Exercises General Exercises - Lower Extremity Ankle Circles/Pumps: AROM Quad Sets: AROM Short Arc Quad: AROM Hip ABduction/ADduction: AROM Straight Leg Raises: AROM  All exercises performed bilaterally x 10 reps in long sitting.      Assessment/Plan    PT Assessment Patient needs continued PT services  PT Diagnosis     PT Problem List Decreased activity tolerance;Decreased knowledge of use of DME;Decreased safety awareness;Decreased mobility  PT Treatment Interventions DME instruction;Gait training;Functional mobility training;Therapeutic activities;Therapeutic exercise   PT Goals (Current goals can be found in the Care Plan section) Acute Rehab PT Goals Patient Stated Goal: To breathe better  PT Goal Formulation: With patient Time For Goal Achievement: 01/17/16 Potential to Achieve Goals: Good    Frequency Min 2X/week   Barriers to discharge           End of Session Equipment Utilized During Treatment: Gait belt;Oxygen (2L O2) Activity Tolerance:  (Limited by desaturation) Patient left: in chair;with call bell/phone within reach Nurse Communication: Mobility status         Time: [id number] PT Time Calculation (min) (ACUTE ONLY): 34 min   Charges:         PT G Codes:        Pegah Segel, SPT 01/03/2016, 1:28 PM

## 2016-01-03 NOTE — Consult Note (Signed)
[facility]  Pulmonary Medicine Consultation      Date: 01/03/2016,   MRN# [id number] Julia Huffman 05/31/1935 Code Status:     Code Status Orders        Start     Ordered   01/02/16 2004  Full code   Continuous     01/02/16 2003    Code Status History    Date Active Date Inactive Code Status Order ID Comments User Context   This patient has a current code status but no historical code status.    Advance Directive Documentation        Most Recent Value   Type of Advance Directive  Healthcare Power of Attorney   Pre-existing out of facility DNR order (yellow form or pink MOST form)     "MOST" Form in Place?             AdmissionWeight: 102 lb (46.267 kg)                 CurrentWeight: 102 lb (46.267 kg) Julia Huffman is a 81 y.o. old female seen in consultation for Resp distress at the request of Dr. Juliene Pina     CHIEF COMPLAINT:  resp distress, SOB    HISTORY OF PRESENT ILLNESS  81 y.o. female with a known history of COPD, hypertension, osteoporosis, pulmonary fibrosis who presents to the hospital due to shortness of breath. Patient just moved from [place] a few weeks back and has not established care with a physician here in this area.  -She has been feeling short of breath now for the past to 3 weeks progressively getting worse.  -She is not on oxygen at home. - She admits to a cough which is productive of yellow sputum but denies any fevers, chills, nausea, vomiting, abdominal pain or any other associated symptoms.  -She presented to the emergency room was noted to be in acute respiratory failure with hypoxia  Patient was admitted and placed oxygen therapy, iv abx and iv steroids  Ct chest images reviewed 01/03/2016 Extensive b/l honeycomb interstitial findings c/w IPF    PAST MEDICAL HISTORY   Past Medical History  Diagnosis Date  . COPD (chronic obstructive pulmonary disease) (HCC)   . Hypertension   . Osteoporosis   . Renal disorder     . Pulmonary fibrosis (HCC)      SURGICAL HISTORY   Past Surgical History  Procedure Laterality Date  . Breast surgery    . Total hip arthroplasty       FAMILY HISTORY   Family History  Problem Relation Age of Onset  . Heart attack Mother      SOCIAL HISTORY   Social History  Substance Use Topics  . Smoking status: Former Smoker -- 0.25 packs/day for 5 years    Types: Cigarettes  . Smokeless tobacco: None  . Alcohol Use: No     MEDICATIONS    Home Medication:  No current outpatient prescriptions on file.  Current Medication:  Current facility-administered medications:  .  acetaminophen (TYLENOL) tablet 650 mg, 650 mg, Oral, Q6H PRN, 650 mg at 01/02/16 2314 **OR** acetaminophen (TYLENOL) suppository 650 mg, 650 mg, Rectal, Q6H PRN, Houston Siren, MD .  aspirin EC tablet 81 mg, 81 mg, Oral, Daily, Houston Siren, MD, 81 mg at 01/03/16 0749 .  azithromycin (ZITHROMAX) tablet 500 mg, 500 mg, Oral, Once **FOLLOWED BY** [START ON 01/04/2016] azithromycin (ZITHROMAX) tablet 250 mg, 250 mg, Oral, Daily, Adrian Saran, MD .  cefUROXime (CEFTIN) tablet 250 mg, 250 mg, Oral, BID WC, Sital Mody, MD .  enoxaparin (LOVENOX) injection 30 mg, 30 mg, Subcutaneous, Q24H, Houston SirenVivek J Sainani, MD, 30 mg at 01/02/16 2133 .  ipratropium-albuterol (DUONEB) 0.5-2.5 (3) MG/3ML nebulizer solution 3 mL, 3 mL, Nebulization, Q6H, Houston SirenVivek J Sainani, MD, 3 mL at 01/03/16 1325 .  methylPREDNISolone sodium succinate (SOLU-MEDROL) 40 mg/mL injection 40 mg, 40 mg, Intravenous, Q12H, Houston SirenVivek J Sainani, MD, 40 mg at 01/03/16 0537 .  ondansetron (ZOFRAN) tablet 4 mg, 4 mg, Oral, Q6H PRN **OR** ondansetron (ZOFRAN) injection 4 mg, 4 mg, Intravenous, Q6H PRN, Houston SirenVivek J Sainani, MD    ALLERGIES   Review of patient's allergies indicates no known allergies.     REVIEW OF SYSTEMS   Review of Systems  Constitutional: Positive for malaise/fatigue. Negative for fever, chills, weight loss and diaphoresis.  HENT:  Positive for congestion. Negative for hearing loss.   Eyes: Negative for blurred vision and double vision.  Respiratory: Positive for cough, sputum production and shortness of breath. Negative for hemoptysis and wheezing.   Cardiovascular: Negative for chest pain, palpitations, orthopnea and leg swelling.  Gastrointestinal: Negative for heartburn, nausea, vomiting and abdominal pain.  Genitourinary: Negative for dysuria and urgency.  Musculoskeletal: Negative for back pain.  Skin: Negative for rash.  Neurological: Positive for weakness. Negative for dizziness and headaches.  Endo/Heme/Allergies: Does not bruise/bleed easily.  Psychiatric/Behavioral: Negative for depression. The patient is nervous/anxious.   All other systems reviewed and are negative.    VS: BP 148/70 mmHg  Pulse 91  Temp(Src) 98.7 F (37.1 C) (Oral)  Resp 18  Ht 5' (1.524 m)  Wt 102 lb (46.267 kg)  BMI 19.92 kg/m2  SpO2 100%     PHYSICAL EXAM  Physical Exam  Constitutional: She is oriented to person, place, and time. She appears well-developed and well-nourished. No distress.  HENT:  Head: Normocephalic and atraumatic.  Mouth/Throat: No oropharyngeal exudate.  Eyes: EOM are normal. Pupils are equal, round, and reactive to light. No scleral icterus.  Neck: Normal range of motion. Neck supple.  Cardiovascular: Normal rate, regular rhythm and normal heart sounds.   No murmur heard. Pulmonary/Chest: No stridor. No respiratory distress. She has no wheezes. She has rales.  Abdominal: Soft. Bowel sounds are normal. She exhibits no distension. There is no tenderness. There is no rebound.  Musculoskeletal: Normal range of motion. She exhibits no edema.  Neurological: She is alert and oriented to person, place, and time. No cranial nerve deficit.  Skin: Skin is warm. She is not diaphoretic.  Psychiatric: She has a normal mood and affect.        LABS    Recent Labs     01/02/16  1549  01/03/16  0500  HGB   13.7  12.1  HCT  40.9  36.6  MCV  82.6  84.9  WBC  8.2  4.1  BUN  25*  25*  CREATININE  1.16*  1.14*  GLUCOSE  95  140*  CALCIUM  10.1  9.3  ,      CULTURE RESULTS   Recent Results (from the past 240 hour(s))  Blood culture (routine x 2)     Status: None (Preliminary result)   Collection Time: 01/02/16  6:15 PM  Result Value Ref Range Status   Specimen Description BLOOD LEFT ASSIST CONTROL  Final   Special Requests BOTTLES DRAWN AEROBIC AND ANAEROBIC 7CC  Final   Culture NO GROWTH < 24 HOURS  Final  Report Status PENDING  Incomplete  Blood culture (routine x 2)     Status: None (Preliminary result)   Collection Time: 01/02/16  6:17 PM  Result Value Ref Range Status   Specimen Description BLOOD RIGHT HAND  Final   Special Requests BOTTLES DRAWN AEROBIC AND ANAEROBIC 5CC  Final   Culture NO GROWTH < 24 HOURS  Final   Report Status PENDING  Incomplete          IMAGING    Dg Chest 2 View  01/02/2016  CLINICAL DATA:  Shortness of breath over the last month, worsening with exertion. COPD. EXAM: CHEST  2 VIEW COMPARISON:  None. FINDINGS: Heart size is at the upper limits of normal. Mediastinal shadows are normal. There is diffuse fibrosis throughout the lungs bilaterally. More focal density in the right upper lobe suggests segmental pneumonia no collapse. No effusion. No acute bone finding. IMPRESSION: Advanced pulmonary fibrosis pattern diffusely. Region of focal density in the right upper lobe consistent with segmental bronchopneumonia. Electronically Signed   By: Paulina FusiMark  Shogry M.D.   On: 01/02/2016 16:53   Ct Chest Wo Contrast  01/02/2016  CLINICAL DATA:  Difficulty breathing, abnormal chest radiograph with pulmonary fibrosis, hypertension, COPD, weight loss, increased shortness of breath with exertion, former smoker EXAM: CT CHEST WITHOUT CONTRAST TECHNIQUE: Multidetector CT imaging of the chest was performed following the standard protocol without IV contrast. Sagittal and  coronal MPR images reconstructed from axial data set. COMPARISON:  Chest radiograph 01/02/2016 FINDINGS: Cardiovascular: Atherosclerotic calcifications aorta, proximal great vessels and coronary arteries. No pericardial effusion. Heart normal size. Mediastinum/Nodes: Base of cervical region unremarkable. Dilated air-filled esophagus proximally, fluid-filled distally. No definite adenopathy. Lungs/Pleura: Severe honeycomb formation in both lower lobes at the periphery of the lingula and RIGHT middle lobe. Scattered mile subpleural honeycomb formation in the upper lobes bilaterally. Scattered areas of parenchymal scarring and interstitial thickening. No pneumothorax or pleural effusion. Multiple areas of parenchymal opacity are somewhat nodular in appearance without dominant mass. Nodular foci 8 x 6 mm laterally LEFT upper lobe image 16 and centrally RIGHT upper lobe 12 x 7 mm image 21. No significant bronchiectasis. Upper Abdomen: Small cyst upper to mid LEFT kidney 14 x 14 mm image 55. Aortic atherosclerotic calcifications. Remaining visualized upper abdomen unremarkable. Musculoskeletal: Bones demineralized.  No acute osseous findings. IMPRESSION: Severe end-stage lung disease with extensive honeycomb formation throughout BILATERAL lower lobes and to lesser degree within remaining lobes peripherally. Few areas of slight nodularity are identified without dominant mass, recommend attention on follow-up imaging, recommendation below. Recommend follow-up noncontrast CT chest in 3 months to reassess these nodules. Aortic atherosclerosis. Electronically Signed   By: Ulyses SouthwardMark  Boles M.D.   On: 01/02/2016 19:45     Images reviewed 01/03/2016 CT chest shows b/l honeycombing interstitial lung disease    ASSESSMENT/PLAN   81 yo thin AAF with acute resp distress from acute pneumonia/bronchitis in the setting of severe end stage ILD c/w Pulm fibrosis Prognosis is very poor, recommend DNR/DNI status  1.oxygen as  needed 2.continue steroids and abx therapy 3.dounebs every 6 hrs   No need for Bronch or Biopsy. Recommend follow up with [facility] as outpatient  The Patient requires high complexity decision making for assessment and support, frequent evaluation and titration of therapies, application of advanced monitoring technologies and extensive interpretation of multiple databases.    Patient satisfied with Plan of action and management. All questions answered  Lucie LeatherKurian David Liberato Stansbery, M.D.  [facility]  Medical Director [place] Director [facility] Cardio-Pulmonary Department

## 2016-01-04 MED ORDER — LEVOFLOXACIN 750 MG PO TABS: 750.0000 mg | ORAL_TABLET | Freq: Every day | ORAL | Status: DC

## 2016-01-04 MED ORDER — PREDNISONE 10 MG PO TABS: 10.0000 mg | ORAL_TABLET | Freq: Every day | ORAL | Status: DC

## 2016-01-04 MED ORDER — RANITIDINE HCL 150 MG PO TABS: 150.0000 mg | ORAL_TABLET | Freq: Two times a day (BID) | ORAL | Status: DC

## 2016-01-04 NOTE — Care Management Note (Addendum)
Case Management Note  Patient Details  Name: Julia Huffman MRN: [id number] Date of Birth: 1934-08-03  Subjective/Objective:    Discussed discharge planning with Mrs Julia Huffman. Explained to Mrs Julia Huffman that [facility] will deliver a portable oxygen tank to her in her hospital room today, and will then set up oxygen in her home. Mrs Julia Huffman has no PCP, and she agreed [facility] to initiate her HH-RN and Aide as soon as she has an appointment with a PCP. All discharge and referral information [facility].                 Action/Plan:   Expected Discharge Date:                  Expected Discharge Plan:     In-House Referral:     Discharge planning Services     Post Acute Care Choice:    Choice offered to:     DME Arranged:    DME Agency:     HH Arranged:    HH Agency:     Status of Service:     If discussed at [employer] Length of Stay Meetings, dates discussed:    Additional Comments:  Pate Aylward A, RN 01/04/2016, 10:24 AM

## 2016-01-04 NOTE — Progress Notes (Signed)
Pt being discharged home with home health at this time, discharge instructions and prescriptions reviewed with pt and daughter, states understanding, o2 in place for discharge, no complaints of pain, no distress or discomfort noted

## 2016-01-04 NOTE — Progress Notes (Signed)
SATURATION QUALIFICATIONS: (This note is used to comply with regulatory documentation for home oxygen)  Patient Saturations on Room Air at Rest = 92%  Patient Saturations on Room Air while Ambulating = 87%  Patient Saturations on 2 Liters of oxygen while Ambulating = 94%  Please briefly explain why patient needs home oxygen: 

## 2016-01-04 NOTE — Discharge Summary (Signed)
[facility] [place] at [facility]   PATIENT NAME: Julia Huffman    MR#:  [id number]  DATE OF BIRTH:  01/01/1935  DATE OF ADMISSION:  01/02/2016 ADMITTING PHYSICIAN: Houston Siren, MD  DATE OF DISCHARGE: 01/04/2016  PRIMARY CARE PHYSICIAN: No PCP Per Patient    ADMISSION DIAGNOSIS:  Pulmonary fibrosis (HCC) [J84.10] Community acquired pneumonia [J18.9] Hypoxia [R09.02]  DISCHARGE DIAGNOSIS:  Active Problems:   Acute on chronic respiratory failure (HCC)   SECONDARY DIAGNOSIS:   Past Medical History  Diagnosis Date  . COPD (chronic obstructive pulmonary disease) (HCC)   . Hypertension   . Osteoporosis   . Renal disorder   . Pulmonary fibrosis [facility])     HOSPITAL COURSE:   81 year old female with history of pulmonary fibrosis who presents with acute hypoxic respiratory failure.   1. Acute hypoxic respiratory failure: This is due to progressive severe pulmonary fibrosis along with community-acquired pneumonia. Continue Levaquin and steroids. Pulmonary consult  appreciated. She needs O2 at discharge and Zantac (inidcated in IPF patients for prevention of micro aspiration).   2. Severe pulmonary fibrosis: Patient will follow-up with pulmonary within 7-10 days.  3. Community acquired pneumonia: Continue Levaquin. She will require oxygen at discharge primarily due to problem #2.  4. History of hypertension: Patient currently not on medications. She would benefit from close outpatient follow-up for blood pressure.   DISCHARGE CONDITIONS AND DIET:   Stable for discharge on heart healthy diet  CONSULTS OBTAINED:  Treatment Team:  [facility], MD Mertie Moores, MD  DRUG ALLERGIES:  No Known Allergies  DISCHARGE MEDICATIONS:   Current Discharge Medication List    START taking these medications   Details  levofloxacin (LEVAQUIN) 750 MG tablet Take 1 tablet (750 mg total) by mouth daily. Qty: 5 tablet, Refills: 0    predniSONE  (DELTASONE) 10 MG tablet Take 1 tablet (10 mg total) by mouth daily with breakfast. : 60 mg PO (ORAL) x 2 days 50 mg PO (ORAL)  x 2 days 40 mg PO (ORAL)  x 2 days 30 mg PO  (ORAL)  x 2 days 20 mg PO  (ORAL) x 2 days 10 mg PO  (ORAL) x 2 days then stop Qty: 42 tablet, Refills: 0      CONTINUE these medications which have NOT CHANGED   Details  aspirin EC 81 MG tablet Take 81 mg by mouth daily.              Today   CHIEF COMPLAINT:  Patient doing well this morning. Shortness of breath is improved.   VITAL SIGNS:  Blood pressure 155/77, pulse 93, temperature 98.2 F (36.8 C), temperature source Oral, resp. rate 18, height 5' (1.524 m), weight 46.267 kg (102 lb), SpO2 94 %.   REVIEW OF SYSTEMS:  Review of Systems  Constitutional: Negative for fever, chills and malaise/fatigue.  HENT: Negative for ear discharge, ear pain, hearing loss, nosebleeds and sore throat.   Eyes: Negative for blurred vision and pain.  Respiratory: Positive for shortness of breath. Negative for cough, hemoptysis and wheezing.   Cardiovascular: Negative for chest pain, palpitations and leg swelling.  Gastrointestinal: Negative for nausea, vomiting, abdominal pain, diarrhea and blood in stool.  Genitourinary: Negative for dysuria.  Musculoskeletal: Negative for back pain.  Neurological: Negative for dizziness, tremors, speech change, focal weakness, seizures and headaches.  Endo/Heme/Allergies: Does not bruise/bleed easily.  Psychiatric/Behavioral: Negative for depression, suicidal ideas and hallucinations.     PHYSICAL EXAMINATION:  GENERAL:  81 y.o.-year-old patient lying in the bed with no acute distress.  NECK:  Supple, no jugular venous distention. No thyroid enlargement, no tenderness.  LUNGS: Fine inspiratory crackles bilaterally, no wheezing, rales,rhonchi  No use of accessory muscles of respiration.  CARDIOVASCULAR: S1, S2 normal. No murmurs, rubs, or gallops.  ABDOMEN: Soft,  non-tender, non-distended. Bowel sounds present. No organomegaly or mass.  EXTREMITIES: No pedal edema, cyanosis, or clubbing.  PSYCHIATRIC: The patient is alert and oriented x 3.  SKIN: No obvious rash, lesion, or ulcer.   DATA REVIEW:   CBC  Recent Labs Lab 01/03/16 0500  WBC 4.1  HGB 12.1  HCT 36.6  PLT 274    Chemistries   Recent Labs Lab 01/02/16 1549 01/03/16 0500  NA 137 139  K 3.4* 4.1  CL 103 108  CO2 25 24  GLUCOSE 95 140*  BUN 25* 25*  CREATININE 1.16* 1.14*  CALCIUM 10.1 9.3  MG 1.6*  --     Cardiac Enzymes  Recent Labs Lab 01/02/16 1549  TROPONINI <0.03    Microbiology Results  @[email or web address] @  RADIOLOGY:  Dg Chest 2 View  01/02/2016  CLINICAL DATA:  Shortness of breath over the last month, worsening with exertion. COPD. EXAM: CHEST  2 VIEW COMPARISON:  None. FINDINGS: Heart size is at the upper limits of normal. Mediastinal shadows are normal. There is diffuse fibrosis throughout the lungs bilaterally. More focal density in the right upper lobe suggests segmental pneumonia no collapse. No effusion. No acute bone finding. IMPRESSION: Advanced pulmonary fibrosis pattern diffusely. Region of focal density in the right upper lobe consistent with segmental bronchopneumonia. Electronically Signed   By: Paulina Fusi M.D.   On: 01/02/2016 16:53   Ct Chest Wo Contrast  01/02/2016  CLINICAL DATA:  Difficulty breathing, abnormal chest radiograph with pulmonary fibrosis, hypertension, COPD, weight loss, increased shortness of breath with exertion, former smoker EXAM: CT CHEST WITHOUT CONTRAST TECHNIQUE: Multidetector CT imaging of the chest was performed following the standard protocol without IV contrast. Sagittal and coronal MPR images reconstructed from axial data set. COMPARISON:  Chest radiograph 01/02/2016 FINDINGS: Cardiovascular: Atherosclerotic calcifications aorta, proximal great vessels and coronary arteries. No pericardial effusion. Heart normal size.  Mediastinum/Nodes: Base of cervical region unremarkable. Dilated air-filled esophagus proximally, fluid-filled distally. No definite adenopathy. Lungs/Pleura: Severe honeycomb formation in both lower lobes at the periphery of the lingula and RIGHT middle lobe. Scattered mile subpleural honeycomb formation in the upper lobes bilaterally. Scattered areas of parenchymal scarring and interstitial thickening. No pneumothorax or pleural effusion. Multiple areas of parenchymal opacity are somewhat nodular in appearance without dominant mass. Nodular foci 8 x 6 mm laterally LEFT upper lobe image 16 and centrally RIGHT upper lobe 12 x 7 mm image 21. No significant bronchiectasis. Upper Abdomen: Small cyst upper to mid LEFT kidney 14 x 14 mm image 55. Aortic atherosclerotic calcifications. Remaining visualized upper abdomen unremarkable. Musculoskeletal: Bones demineralized.  No acute osseous findings. IMPRESSION: Severe end-stage lung disease with extensive honeycomb formation throughout BILATERAL lower lobes and to lesser degree within remaining lobes peripherally. Few areas of slight nodularity are identified without dominant mass, recommend attention on follow-up imaging, recommendation below. Recommend follow-up noncontrast CT chest in 3 months to reassess these nodules. Aortic atherosclerosis. Electronically Signed   By: Ulyses Southward M.D.   On: 01/02/2016 19:45      Management plans discussed with the patient and daughter in she is in agreement. Stable for discharge home with home health  Patient  should follow up with pulmonary in 10 days  CODE STATUS:     Code Status Orders        Start     Ordered   01/02/16 2004  Full code   Continuous     01/02/16 2003    Code Status History    Date Active Date Inactive Code Status Order ID Comments User Context   This patient has a current code status but no historical code status.    Advance Directive Documentation        Most Recent Value   Type of  Advance Directive  Healthcare Power of Attorney   Pre-existing out of facility DNR order (yellow form or pink MOST form)     "MOST" Form in Place?        TOTAL TIME TAKING CARE OF THIS PATIENT: 35 minutes.    Note: This dictation was prepared with Dragon dictation along with smaller phrase technology. Any transcriptional errors that result from this process are unintentional.  Tondra Reierson M.D on 01/04/2016 at 9:51 AM  Between 7am to 6pm - Pager - [phone] After 6pm go to [email or web address] - Social research officer, governmentpassword [email or web address]  [facility] [facility]  Office  [phone]  CC: Primary care physician; No PCP Per Patient

## 2016-01-04 NOTE — Discharge Summary (Signed)
[facility] [place] at [facility]   PATIENT NAME: Julia Huffman    MR#:  [id number]  DATE OF BIRTH:  19-Feb-1935  DATE OF ADMISSION:  01/02/2016 ADMITTING PHYSICIAN: Houston Siren, MD  DATE OF DISCHARGE: 01/04/2016  PRIMARY CARE PHYSICIAN: No PCP Per Patient    ADMISSION DIAGNOSIS:  Pulmonary fibrosis (HCC) [J84.10] Community acquired pneumonia [J18.9] Hypoxia [R09.02]  DISCHARGE DIAGNOSIS:  Active Problems:   Acute on chronic respiratory failure (HCC)   SECONDARY DIAGNOSIS:   Past Medical History  Diagnosis Date  . COPD (chronic obstructive pulmonary disease) (HCC)   . Hypertension   . Osteoporosis   . Renal disorder   . Pulmonary fibrosis [facility])     HOSPITAL COURSE:   81 year old female with history of pulmonary fibrosis who presents with acute hypoxic respiratory failure.   1. Acute hypoxic respiratory failure: This is due to progressive severe pulmonary fibrosis along with community-acquired pneumonia. Continue Levaquin and steroids. Pulmonary consult  appreciated. She needs O2 at discharge and Zantac (inidcated in IPF patients for prevention of micro aspiration).   2. Severe pulmonary fibrosis: Patient will follow-up with pulmonary within 7-10 days.  3. Community acquired pneumonia: Continue Levaquin. She will require oxygen at discharge primarily due to problem #2.  4. History of hypertension: Patient currently not on medications. She would benefit from close outpatient follow-up for blood pressure.   DISCHARGE CONDITIONS AND DIET:   Stable for discharge on heart healthy diet  CONSULTS OBTAINED:  Treatment Team:  [facility], MD Mertie Moores, MD  DRUG ALLERGIES:  No Known Allergies  DISCHARGE MEDICATIONS:   Current Discharge Medication List    START taking these medications   Details  levofloxacin (LEVAQUIN) 750 MG tablet Take 1 tablet (750 mg total) by mouth daily. Qty: 5 tablet, Refills: 0    predniSONE  (DELTASONE) 10 MG tablet Take 1 tablet (10 mg total) by mouth daily with breakfast. : 60 mg PO (ORAL) x 2 days 50 mg PO (ORAL)  x 2 days 40 mg PO (ORAL)  x 2 days 30 mg PO  (ORAL)  x 2 days 20 mg PO  (ORAL) x 2 days 10 mg PO  (ORAL) x 2 days then stop Qty: 42 tablet, Refills: 0    ranitidine (ZANTAC) 150 MG tablet Take 1 tablet (150 mg total) by mouth 2 (two) times daily. Qty: 60 tablet, Refills: 0      CONTINUE these medications which have NOT CHANGED   Details  aspirin EC 81 MG tablet Take 81 mg by mouth daily.              Today   CHIEF COMPLAINT:  Patient doing well this morning. Shortness of breath is improved.   VITAL SIGNS:  Blood pressure 155/77, pulse 93, temperature 98.2 F (36.8 C), temperature source Oral, resp. rate 18, height 5' (1.524 m), weight 46.267 kg (102 lb), SpO2 94 %.   REVIEW OF SYSTEMS:  Review of Systems  Constitutional: Negative for fever, chills and malaise/fatigue.  HENT: Negative for ear discharge, ear pain, hearing loss, nosebleeds and sore throat.   Eyes: Negative for blurred vision and pain.  Respiratory: Positive for shortness of breath. Negative for cough, hemoptysis and wheezing.   Cardiovascular: Negative for chest pain, palpitations and leg swelling.  Gastrointestinal: Negative for nausea, vomiting, abdominal pain, diarrhea and blood in stool.  Genitourinary: Negative for dysuria.  Musculoskeletal: Negative for back pain.  Neurological: Negative for dizziness, tremors, speech change, focal weakness,  seizures and headaches.  Endo/Heme/Allergies: Does not bruise/bleed easily.  Psychiatric/Behavioral: Negative for depression, suicidal ideas and hallucinations.     PHYSICAL EXAMINATION:  GENERAL:  81 y.o.-year-old patient lying in the bed with no acute distress.  NECK:  Supple, no jugular venous distention. No thyroid enlargement, no tenderness.  LUNGS: Fine inspiratory crackles bilaterally, no wheezing, rales,rhonchi  No use  of accessory muscles of respiration.  CARDIOVASCULAR: S1, S2 normal. No murmurs, rubs, or gallops.  ABDOMEN: Soft, non-tender, non-distended. Bowel sounds present. No organomegaly or mass.  EXTREMITIES: No pedal edema, cyanosis, or clubbing.  PSYCHIATRIC: The patient is alert and oriented x 3.  SKIN: No obvious rash, lesion, or ulcer.   DATA REVIEW:   CBC  Recent Labs Lab 01/03/16 0500  WBC 4.1  HGB 12.1  HCT 36.6  PLT 274    Chemistries   Recent Labs Lab 01/02/16 1549 01/03/16 0500  NA 137 139  K 3.4* 4.1  CL 103 108  CO2 25 24  GLUCOSE 95 140*  BUN 25* 25*  CREATININE 1.16* 1.14*  CALCIUM 10.1 9.3  MG 1.6*  --     Cardiac Enzymes  Recent Labs Lab 01/02/16 1549  TROPONINI <0.03    Microbiology Results  @  RADIOLOGY:  Dg Chest 2 View  01/02/2016  CLINICAL DATA:  Shortness of breath over the last month, worsening with exertion. COPD. EXAM: CHEST  2 VIEW COMPARISON:  None. FINDINGS: Heart size is at the upper limits of normal. Mediastinal shadows are normal. There is diffuse fibrosis throughout the lungs bilaterally. More focal density in the right upper lobe suggests segmental pneumonia no collapse. No effusion. No acute bone finding. IMPRESSION: Advanced pulmonary fibrosis pattern diffusely. Region of focal density in the right upper lobe consistent with segmental bronchopneumonia. Electronically Signed   By: Paulina Fusi M.D.   On: 01/02/2016 16:53   Ct Chest Wo Contrast  01/02/2016  CLINICAL DATA:  Difficulty breathing, abnormal chest radiograph with pulmonary fibrosis, hypertension, COPD, weight loss, increased shortness of breath with exertion, former smoker EXAM: CT CHEST WITHOUT CONTRAST TECHNIQUE: Multidetector CT imaging of the chest was performed following the standard protocol without IV contrast. Sagittal and coronal MPR images reconstructed from axial data set. COMPARISON:  Chest radiograph 01/02/2016 FINDINGS: Cardiovascular: Atherosclerotic  calcifications aorta, proximal great vessels and coronary arteries. No pericardial effusion. Heart normal size. Mediastinum/Nodes: Base of cervical region unremarkable. Dilated air-filled esophagus proximally, fluid-filled distally. No definite adenopathy. Lungs/Pleura: Severe honeycomb formation in both lower lobes at the periphery of the lingula and RIGHT middle lobe. Scattered mile subpleural honeycomb formation in the upper lobes bilaterally. Scattered areas of parenchymal scarring and interstitial thickening. No pneumothorax or pleural effusion. Multiple areas of parenchymal opacity are somewhat nodular in appearance without dominant mass. Nodular foci 8 x 6 mm laterally LEFT upper lobe image 16 and centrally RIGHT upper lobe 12 x 7 mm image 21. No significant bronchiectasis. Upper Abdomen: Small cyst upper to mid LEFT kidney 14 x 14 mm image 55. Aortic atherosclerotic calcifications. Remaining visualized upper abdomen unremarkable. Musculoskeletal: Bones demineralized.  No acute osseous findings. IMPRESSION: Severe end-stage lung disease with extensive honeycomb formation throughout BILATERAL lower lobes and to lesser degree within remaining lobes peripherally. Few areas of slight nodularity are identified without dominant mass, recommend attention on follow-up imaging, recommendation below. Recommend follow-up noncontrast CT chest in 3 months to reassess these nodules. Aortic atherosclerosis. Electronically Signed   By: Ulyses Southward M.D.   On: 01/02/2016 19:45  Management plans discussed with the patient and daughter in she is in agreement. Stable for discharge home with home health  Patient should follow up with pulmonary in 10 days  CODE STATUS:     Code Status Orders        Start     Ordered   01/02/16 2004  Full code   Continuous     01/02/16 2003    Code Status History    Date Active Date Inactive Code Status Order ID Comments User Context   This patient has a current code  status but no historical code status.    Advance Directive Documentation        Most Recent Value   Type of Advance Directive  Healthcare Power of Attorney   Pre-existing out of facility DNR order (yellow form or pink MOST form)     "MOST" Form in Place?        TOTAL TIME TAKING CARE OF THIS PATIENT: 35 minutes.    Note: This dictation was prepared with Dragon dictation along with smaller phrase technology. Any transcriptional errors that result from this process are unintentional.  Oceanna Arruda M.D on 01/04/2016 at 9:56 AM  Between 7am to 6pm - Pager - [phone] After 6pm go to [email or web address] - Social research officer, governmentpassword [email or web address]  [facility] [place] [facility]  Office  [phone]  CC: Primary care physician; No PCP Per Patient

## 2016-01-07 LAB — CULTURE, BLOOD (ROUTINE X 2)
CULTURE: NO GROWTH
Culture: NO GROWTH

## 2016-01-15 ENCOUNTER — Encounter: Payer: Self-pay | Admitting: Internal Medicine

## 2016-01-15 ENCOUNTER — Ambulatory Visit (INDEPENDENT_AMBULATORY_CARE_PROVIDER_SITE_OTHER): Payer: Medicare Other | Admitting: Internal Medicine

## 2016-01-15 DIAGNOSIS — J9610 Chronic respiratory failure, unspecified whether with hypoxia or hypercapnia: Secondary | ICD-10-CM | POA: Insufficient documentation

## 2016-01-15 DIAGNOSIS — J8410 Pulmonary fibrosis, unspecified: Secondary | ICD-10-CM | POA: Diagnosis not present

## 2016-01-15 DIAGNOSIS — J189 Pneumonia, unspecified organism: Secondary | ICD-10-CM

## 2016-01-15 DIAGNOSIS — J84112 Idiopathic pulmonary fibrosis: Secondary | ICD-10-CM | POA: Diagnosis not present

## 2016-01-15 NOTE — Progress Notes (Signed)
[facility] Pulmonary Medicine Consultation      Date: 01/15/2016,   MRN# [id number] Julia Huffman [birth year] Code Status:  Code Status History    Date Active Date Inactive Code Status Order ID Comments User Context   01/02/2016  8:03 PM 01/04/2016  5:18 PM Full Code [id number]  Houston Siren, MD Inpatient     Hosp day:@LENGTHOFSTAYDAYS @ Referring MD: @ATDPROV @     PCP:      AdmissionWeight: 103 lb (46.72 kg)                 CurrentWeight: 103 lb (46.72 kg) Julia Huffman is a 81 y.o. old female seen for hospital follow up  For fibrosis      CHIEF COMPLAINT:   SOB   HISTORY OF PRESENT ILLNESS  81 y.o. female with a known history of pulmonary fibrosis,  Was recently admitted adn discharged to the hospital due to shortness of breath.  Patient just moved from [place] a few months back and has not established care with a physician here in this area.  -She has been feeling short of breath now for the past to several months and progressively getting worse.  -She is not on oxygen at home. -she was a previous smoker 25 pack year  Hospital Course -She presented to the emergency room was noted to be in acute respiratory failure with hypoxia  Patient was admitted and placed oxygen therapy, iv abx and iv steroids Patient breathing improved  Ct chest images reviewed 01/15/2016 Extensive b/l honeycomb interstitial findings c/w IPF    PAST MEDICAL HISTORY   Past Medical History  Diagnosis Date  . COPD (chronic obstructive pulmonary disease) (HCC)   . Hypertension   . Osteoporosis   . Renal disorder   . Pulmonary fibrosis (HCC)      SURGICAL HISTORY   Past Surgical History  Procedure Laterality Date  . Breast surgery    . Total hip arthroplasty       FAMILY HISTORY   Family History  Problem Relation Age of Onset  . Heart attack Mother      SOCIAL HISTORY   Social History  Substance Use Topics  . Smoking status: Former Smoker -- 0.25  packs/day for 5 years    Types: Cigarettes  . Smokeless tobacco: None  . Alcohol Use: No     MEDICATIONS    Home Medication:  Current Outpatient Rx  Name  Route  Sig  Dispense  Refill  . aspirin EC 81 MG tablet   Oral   Take 81 mg by mouth daily.         Marland Kitchen levofloxacin (LEVAQUIN) 750 MG tablet   Oral   Take 1 tablet (750 mg total) by mouth daily.   5 tablet   0   . predniSONE (DELTASONE) 10 MG tablet   Oral   Take 1 tablet (10 mg total) by mouth daily with breakfast. : 60 mg PO (ORAL) x 2 days 50 mg PO (ORAL)  x 2 days 40 mg PO (ORAL)  x 2 days 30 mg PO  (ORAL)  x 2 days 20 mg PO  (ORAL) x 2 days 10 mg PO  (ORAL) x 2 days then stop   42 tablet   0     Label  & dispense according to the schedule below: ...   . ranitidine (ZANTAC) 150 MG tablet   Oral   Take 1 tablet (150 mg total) by mouth 2 (two) times daily.  60 tablet   0     Current Medication:  Current outpatient prescriptions:  .  aspirin EC 81 MG tablet, Take 81 mg by mouth daily., Disp: , Rfl:  .  levofloxacin (LEVAQUIN) 750 MG tablet, Take 1 tablet (750 mg total) by mouth daily., Disp: 5 tablet, Rfl: 0 .  predniSONE (DELTASONE) 10 MG tablet, Take 1 tablet (10 mg total) by mouth daily with breakfast. : 60 mg PO (ORAL) x 2 days 50 mg PO (ORAL)  x 2 days 40 mg PO (ORAL)  x 2 days 30 mg PO  (ORAL)  x 2 days 20 mg PO  (ORAL) x 2 days 10 mg PO  (ORAL) x 2 days then stop, Disp: 42 tablet, Rfl: 0 .  ranitidine (ZANTAC) 150 MG tablet, Take 1 tablet (150 mg total) by mouth 2 (two) times daily., Disp: 60 tablet, Rfl: 0    ALLERGIES   Review of patient's allergies indicates no known allergies.     REVIEW OF SYSTEMS   Review of Systems  Constitutional: Positive for malaise/fatigue. Negative for fever, chills, weight loss and diaphoresis.  HENT: Negative for congestion and hearing loss.   Eyes: Negative for blurred vision and double vision.  Respiratory: Positive for shortness of breath. Negative for  cough, hemoptysis, sputum production and wheezing.   Cardiovascular: Negative for chest pain, palpitations and orthopnea.  Gastrointestinal: Negative for heartburn, nausea, vomiting and abdominal pain.  Genitourinary: Negative for dysuria and urgency.  Musculoskeletal: Negative for joint pain.  Skin: Negative for rash.  Neurological: Negative for dizziness, weakness and headaches.  Endo/Heme/Allergies: Does not bruise/bleed easily.  Psychiatric/Behavioral: Negative for depression. The patient is not nervous/anxious.   All other systems reviewed and are negative.    VS: BP 130/90 mmHg  Pulse 51  Wt 103 lb (46.72 kg)  SpO2 93%     PHYSICAL EXAM  Physical Exam  Constitutional: She is oriented to person, place, and time. No distress.  HENT:  Head: Normocephalic and atraumatic.  Eyes: EOM are normal. Pupils are equal, round, and reactive to light. No scleral icterus.  Neck: Normal range of motion. Neck supple.  Cardiovascular: Normal rate, regular rhythm and normal heart sounds.   No murmur heard. Pulmonary/Chest: No stridor. No respiratory distress. She has no wheezes. She has rales.  Abdominal: Soft. Bowel sounds are normal.  Musculoskeletal: Normal range of motion. She exhibits no edema.  Neurological: She is alert and oriented to person, place, and time. No cranial nerve deficit.  Skin: Skin is warm. She is not diaphoretic.  Psychiatric: She has a normal mood and affect.           IMAGING    Dg Chest 2 View  01/02/2016  CLINICAL DATA:  Shortness of breath over the last month, worsening with exertion. COPD. EXAM: CHEST  2 VIEW COMPARISON:  None. FINDINGS: Heart size is at the upper limits of normal. Mediastinal shadows are normal. There is diffuse fibrosis throughout the lungs bilaterally. More focal density in the right upper lobe suggests segmental pneumonia no collapse. No effusion. No acute bone finding. IMPRESSION: Advanced pulmonary fibrosis pattern diffusely. Region  of focal density in the right upper lobe consistent with segmental bronchopneumonia. Electronically Signed   By: Paulina FusiMark  Shogry M.D.   On: 01/02/2016 16:53   Ct Chest Wo Contrast  01/02/2016  CLINICAL DATA:  Difficulty breathing, abnormal chest radiograph with pulmonary fibrosis, hypertension, COPD, weight loss, increased shortness of breath with exertion, former smoker EXAM: CT CHEST WITHOUT  CONTRAST TECHNIQUE: Multidetector CT imaging of the chest was performed following the standard protocol without IV contrast. Sagittal and coronal MPR images reconstructed from axial data set. COMPARISON:  Chest radiograph 01/02/2016 FINDINGS: Cardiovascular: Atherosclerotic calcifications aorta, proximal great vessels and coronary arteries. No pericardial effusion. Heart normal size. Mediastinum/Nodes: Base of cervical region unremarkable. Dilated air-filled esophagus proximally, fluid-filled distally. No definite adenopathy. Lungs/Pleura: Severe honeycomb formation in both lower lobes at the periphery of the lingula and RIGHT middle lobe. Scattered mile subpleural honeycomb formation in the upper lobes bilaterally. Scattered areas of parenchymal scarring and interstitial thickening. No pneumothorax or pleural effusion. Multiple areas of parenchymal opacity are somewhat nodular in appearance without dominant mass. Nodular foci 8 x 6 mm laterally LEFT upper lobe image 16 and centrally RIGHT upper lobe 12 x 7 mm image 21. No significant bronchiectasis. Upper Abdomen: Small cyst upper to mid LEFT kidney 14 x 14 mm image 55. Aortic atherosclerotic calcifications. Remaining visualized upper abdomen unremarkable. Musculoskeletal: Bones demineralized.  No acute osseous findings. IMPRESSION: Severe end-stage lung disease with extensive honeycomb formation throughout BILATERAL lower lobes and to lesser degree within remaining lobes peripherally. Few areas of slight nodularity are identified without dominant mass, recommend attention on  follow-up imaging, recommendation below. Recommend follow-up noncontrast CT chest in 3 months to reassess these nodules. Aortic atherosclerosis. Electronically Signed   By: Ulyses Southward M.D.   On: 01/02/2016 19:45  images reviewed extensive b/l ILD honey comb pattern   ASSESSMENT/PLAN   81 yo AAF with severe end stage lung disease c/w honeycomb pattern c/w Pulmonary Fibrosis with chronic hypoxic resp failure At this time, and at her age,  there is not much therapy that can reverse her lung disease  Plan -Patient will need oxygen with exertion and probably at night will need to check overnight pulse oximetry -Will assess for portable oxygen tanks -Avoid second hand smoke  Prognosis is very poor, will address end of life discussion and DNR status at next visit    The Patient requires high complexity decision making for assessment and support, frequent evaluation and titration of therapies, application of advanced monitoring technologies and extensive interpretation of multiple databases.  Patient/Family are satisfied with Plan of action and management. All questions answered  Lucie Leather, M.D.  [facility]  Medical Director [facility] Medical Director [facility] Cardio-Pulmonary Department

## 2016-01-15 NOTE — Patient Instructions (Signed)
1.overnight pulse oximetry 2.needs 2 L oxygen with exertion  Pulmonary Fibrosis Pulmonary fibrosis is a type of lung disease that causes scarring. Over time, the scar tissue (fibrosis) builds up in the air sacs of your lungs. This makes it hard for you to breathe. Less oxygen can get into your blood. Scarring from pulmonary fibrosis gets worse over time. This damage is permanent. Having damaged lungs may make it more likely that you will have heart problems as well. CAUSES Usually, the cause of pulmonary fibrosis is not known (idiopathic pulmonary fibrosis). However, pulmonary fibrosis can be caused by:   Exposure to occupational and environmental toxins. These include asbestos, silica, and metal dusts.  Inhaling moldy hay. This can cause an allergic reaction in the lung (farmer's lung) that can lead to pulmonary fibrosis.  Inhaling toxic fumes.  Certain medicines. These include drugs used in radiation therapy or used to treat seizures, heart problems, and some infections.  Autoimmune diseases, such as rheumatoid arthritis, systemic sclerosis, and connective tissue diseases.  Sarcoidosis. In this disease, areas of inflammatory cells (granulomas) form and most often affect the lungs.  Genes. Some cases of pulmonary fibrosis may be passed down through families. RISK FACTORS You may be at a higher risk for developing pulmonary fibrosis if:  You have a family history of the disease.  You have an autoimmune disease or another condition linked to pulmonary fibrosis.  You are exposed to certain substances or fumes found in agricultural, farm, Holiday representativeconstruction, or factory work.  You take certain medicines. SIGNS AND SYMPTOMS Symptoms may include:  Difficulty breathing that gets worse with activity.  Dry, hacking cough.  Rapid, shallow breathing during exercise or while at rest.  Shortness of breath that gets worse (dyspnea).  Bluish skin and lips.  Loss of appetite.  Loss of  strength.  Weight loss and fatigue.  Rounded and enlarged fingertips (clubbing). DIAGNOSIS Your health care provider may suspect pulmonary fibrosis based on your symptoms and medical history. Diagnosis may include a physical exam. Your health care provider will check for signs that strongly suggest that you have pulmonary fibrosis, such as:  Blue skin around your fingernails or mouth from reduced oxygen.  Clubbing around the ends of your fingers.  A crackling sound when you breathe. Your health care provider may also do tests to confirm the diagnosis. These may include:  Looking inside your lungs with an instrument (bronchoscopy).  Imaging studies of your lungs and heart using:  X-rays.  CT scan.  Sound waves (echocardiogram).  Tests to measure how well you are breathing (pulmonary function tests).  Exercise testing to see how well your lungs work while you are walking.  Blood tests.  A procedure to remove a small piece of lung tissue to examine in a lab (biopsy). TREATMENT There is no cure for pulmonary fibrosis. Treatments focus on managing symptoms and preventing scarring from getting worse. This can include:  Medicines.  You may take steroids to prevent permanent lung changes. Your health care provider may put you on a high dose at first, then on lower dosages for the long term.  Medicines to suppress your body's defense (immune) system. These can have serious side effects.  You may be monitored with X-rays and laboratory work.  Oxygen therapy may be helpful if oxygen in your blood is low.  Surgery. In some cases, a lung transplant is an option. HOME CARE INSTRUCTIONS  Take medicines only as directed by your health care provider.  Keep your vaccinations up  to date as recommended by your health care provider.  Do not use any tobacco products, including cigarettes, chewing tobacco, or electronic cigarettes. If you need help quitting, ask your health care  provider.  Get regular exercise, but do not overexert yourself. Ask your health care provider to suggest some activities that are safe for you to do. Walking and chair exercises can help if you have physical limitations.  Consider joining a pulmonary rehabilitation program or a support group for people with pulmonary fibrosis.  Eat small meals often so you do not get too full. Overeating can make breathing trouble worse.  Maintain a healthy weight. Lose weight if you need to.  Do breathing exercises as directed by your health care provider.  Keep all follow-up visits as directed by your health care provider. This is important. SEEK MEDICAL CARE IF:  You are not able to be as active as usual.  You have a long-lasting (chronic) cough.  You are often short of breath.  You have a fever or chills. SEEK IMMEDIATE MEDICAL CARE IF:  You have chest pain.  Your breathing is much worse.  You cannot take a deep breath.  You have blue skin around your mouth or fingers.  You have clubbing of your fingers.  You cough up mucus that is dark in color.  You have a lot of headaches.  You get very confused or sleepy.   This information is not intended to replace advice given to you by your health care provider. Make sure you discuss any questions you have with your health care provider.   Document Released: 09/05/2003 Document Revised: 07/06/2014 Document Reviewed: 11/23/2013 Elsevier Interactive Patient Education [employer]

## 2016-01-16 ENCOUNTER — Telehealth: Payer: Self-pay | Admitting: Internal Medicine

## 2016-01-16 NOTE — Telephone Encounter (Signed)
They need to call PCP

## 2016-01-16 NOTE — Telephone Encounter (Signed)
Informed daughter and she states pt isn't able to see PCP for 2 weeks due to getting established. Informed daughter to take pt to UC to be treated. She agreed. Nothing further needed.

## 2016-01-16 NOTE — Telephone Encounter (Signed)
Pt daughter called, states pt BP has been elevated since pt left yesterday. 168/98, 2 hours ago. Yesterday was 160/90. Both of these readings were taken by her PT. States pt does complain of a headache. Does complain of some dizziness,states this may be coming from her oxygen

## 2016-01-16 NOTE — Telephone Encounter (Signed)
Please advise if you want pt to call PCP in regards to this or advise further. Thanks.

## 2016-01-22 ENCOUNTER — Ambulatory Visit (INDEPENDENT_AMBULATORY_CARE_PROVIDER_SITE_OTHER): Payer: Medicare Other | Admitting: Family Medicine

## 2016-01-22 ENCOUNTER — Encounter: Payer: Self-pay | Admitting: Family Medicine

## 2016-01-22 VITALS — BP 130/85 | HR 111 | Temp 97.6°F | Ht 63.75 in | Wt 100.5 lb

## 2016-01-22 DIAGNOSIS — J8410 Pulmonary fibrosis, unspecified: Secondary | ICD-10-CM | POA: Diagnosis not present

## 2016-01-22 DIAGNOSIS — M810 Age-related osteoporosis without current pathological fracture: Secondary | ICD-10-CM | POA: Diagnosis not present

## 2016-01-22 DIAGNOSIS — N183 Chronic kidney disease, stage 3 unspecified: Secondary | ICD-10-CM

## 2016-01-22 DIAGNOSIS — R490 Dysphonia: Secondary | ICD-10-CM

## 2016-01-22 DIAGNOSIS — R1310 Dysphagia, unspecified: Secondary | ICD-10-CM

## 2016-01-22 MED ORDER — ZOSTER VACCINE LIVE 19400 UNT/0.65ML ~~LOC~~ SUSR: 0.6500 mL | Freq: Once | SUBCUTANEOUS | 0 refills | Status: AC

## 2016-01-22 NOTE — Progress Notes (Signed)
Pre visit review using our clinic review tool, if applicable. No additional management support is needed unless otherwise documented below in the visit note. 

## 2016-01-22 NOTE — Patient Instructions (Signed)
Follow up in 3 months.  Call with concerns/issues.  Ask for Ashleigh.  Take care  Dr. Adriana Simas

## 2016-01-23 ENCOUNTER — Encounter: Payer: Self-pay | Admitting: Family Medicine

## 2016-01-23 DIAGNOSIS — N183 Chronic kidney disease, stage 3 unspecified: Secondary | ICD-10-CM | POA: Insufficient documentation

## 2016-01-23 DIAGNOSIS — R490 Dysphonia: Secondary | ICD-10-CM | POA: Insufficient documentation

## 2016-01-23 DIAGNOSIS — R1310 Dysphagia, unspecified: Secondary | ICD-10-CM | POA: Insufficient documentation

## 2016-01-23 NOTE — Assessment & Plan Note (Signed)
Reports intermittent solid and liquid dysphagia. Sending to ENT for eval.

## 2016-01-23 NOTE — Progress Notes (Signed)
Subjective:  Patient ID: Julia Huffman, female    DOB: 1935/04/05  Age: 81 y.o. MRN: [id number]  CC: Establish care  HPI Julia Huffman is a 81 y.o. female presents to the clinic today to establish care. Issues/concerns are below.  Pulmonary fibrosis/Chronic respiratory failure  Severe/end-stage.  Prognosis poor.  Patient is on supplement oxygen and continues to be dyspneic with conversation and minimal activity.  Followed by pulmonology.  Osteoporosis  Not currently on treatment.  Will discuss merits of this today.  CKD III  Stable.  Was previously followed by nephrology.  Hoarseness & Dysphagia  Patient reports that she has been "hoarse" for the past few weeks. She states that with activity as well as conversation her voice gives out and she has difficulty speaking.  Additionally, patient states that she's had intermittent trouble swallowing for the past 1-2 months.  She states that it is with both solids and liquids.  She has been losing weight.  PMH, Surgical Hx, Family Hx, Social History reviewed and updated as below.  Past Medical History:  Diagnosis Date  . Arthritis   . Chicken pox   . Chronic kidney disease   . History of vasculitis    History of Microscopic polyangitis  . Hypertension   . Osteoporosis   . Pulmonary fibrosis (HCC)    Past Surgical History:  Procedure Laterality Date  . BREAST SURGERY    . TOTAL HIP ARTHROPLASTY     Family History  Problem Relation Age of Onset  . Heart attack Mother   . Arthritis Mother   . Heart disease Mother   . Hypertension Mother    Social History  Substance Use Topics  . Smoking status: Former Smoker    Packs/day: 0.25    Years: 5.00    Types: Cigarettes    Quit date: 01/22/1991  . Smokeless tobacco: Never Used  . Alcohol use No   Review of Systems  HENT: Positive for trouble swallowing.        Hoarseness.  Respiratory: Positive for cough and shortness of breath.     Cardiovascular: Positive for chest pain.  Neurological: Positive for dizziness, weakness and headaches.  Psychiatric/Behavioral:       Anxiety/stress.  All other systems reviewed and are negative.  Objective:   Today's Vitals: BP 130/85 (BP Location: Left Arm, Patient Position: Sitting, Cuff Size: Normal)   Pulse (!) 111   Temp 97.6 F (36.4 C) (Oral)   Ht 5' 3.75" (1.619 m)   Wt 100 lb 8 oz (45.6 kg)   BMI 17.39 kg/m   Physical Exam  Constitutional: She is oriented to person, place, and time.  In elderly female, nasal cannula oxygen in place. Dyspneic with conversation and minimal activity.  HENT:  Mouth/Throat: Oropharynx is clear and moist.  Eyes: Conjunctivae are normal. No scleral icterus.  Neck: Neck supple.  Cardiovascular: Regular rhythm.  Tachycardia present.   Pulmonary/Chest:  Diffuse crackles noted. Increased work of breathing. Has difficulty with conversation.  Abdominal: Soft. She exhibits no distension. There is no tenderness. There is no rebound and no guarding.  Musculoskeletal: Normal range of motion.  Neurological: She is alert and oriented to person, place, and time.  Skin: Skin is warm and dry. No rash noted.  Psychiatric: She has a normal mood and affect.  Vitals reviewed.  Assessment & Plan:   Problem List Items Addressed This Visit    Postinflammatory pulmonary fibrosis (HCC)    Established problem, end-stage. No further interventions  are needed or will aid her. I discussed the poor prognosis and the need for discussion around end-of-life care/issues. The daughter and the patient are aware of this and have been discussing.      Osteoporosis    Given her age and advanced former fibrosis, after discussion we elected to not proceed with treatment      CKD (chronic kidney disease) stage 3, GFR 30-59 ml/min    Stable. No need for referral at this time.      Hoarseness    New problem. This appears to be secondary to pulmonary fibrosis and  associated shortness of breath. However, given reported dysphagia as well sending to ENT for evaluation.      Relevant Orders   Ambulatory referral to ENT   Dysphagia    Reports intermittent solid and liquid dysphagia. Sending to ENT for eval.      Relevant Orders   Ambulatory referral to ENT    Other Visit Diagnoses   None.     Outpatient Encounter Prescriptions as of 01/22/2016  Medication Sig  . aspirin EC 81 MG tablet Take 81 mg by mouth daily.  . ranitidine (ZANTAC) 150 MG tablet Take 1 tablet (150 mg total) by mouth 2 (two) times daily.  . [EXPIRED] Zoster Vaccine Live, PF, (ZOSTAVAX) 19417 UNT/0.65ML injection Inject 19,400 Units into the skin once.  . [DISCONTINUED] levofloxacin (LEVAQUIN) 750 MG tablet Take 1 tablet (750 mg total) by mouth daily.  . [DISCONTINUED] predniSONE (DELTASONE) 10 MG tablet Take 1 tablet (10 mg total) by mouth daily with breakfast. : 60 mg PO (ORAL) x 2 days 50 mg PO (ORAL)  x 2 days 40 mg PO (ORAL)  x 2 days 30 mg PO  (ORAL)  x 2 days 20 mg PO  (ORAL) x 2 days 10 mg PO  (ORAL) x 2 days then stop   No facility-administered encounter medications on file as of 01/22/2016.     Follow-up: Return in about 3 months (around 04/23/2016) for Follow up Chronic medical issues.  Everlene Other DO [facility]

## 2016-01-23 NOTE — Assessment & Plan Note (Signed)
Stable. No need for referral at this time.

## 2016-01-23 NOTE — Assessment & Plan Note (Signed)
Given her age and advanced former fibrosis, after discussion we elected to not proceed with treatment

## 2016-01-23 NOTE — Assessment & Plan Note (Signed)
Established problem, end-stage. No further interventions are needed or will aid her. I discussed the poor prognosis and the need for discussion around end-of-life care/issues. The daughter and the patient are aware of this and have been discussing.

## 2016-01-23 NOTE — Assessment & Plan Note (Signed)
New problem. This appears to be secondary to pulmonary fibrosis and associated shortness of breath. However, given reported dysphagia as well sending to ENT for evaluation.

## 2016-01-30 ENCOUNTER — Encounter: Payer: Self-pay | Admitting: Family Medicine

## 2016-02-03 ENCOUNTER — Encounter: Payer: Self-pay | Admitting: Family Medicine

## 2016-02-04 ENCOUNTER — Encounter: Payer: Self-pay | Admitting: Family Medicine

## 2016-02-13 ENCOUNTER — Encounter: Payer: Self-pay | Admitting: Emergency Medicine

## 2016-02-13 ENCOUNTER — Emergency Department: Payer: Medicare Other

## 2016-02-13 ENCOUNTER — Telehealth: Payer: Self-pay | Admitting: *Deleted

## 2016-02-13 ENCOUNTER — Emergency Department
Admission: EM | Admit: 2016-02-13 | Discharge: 2016-02-13 | Disposition: A | Discharge status: Home / Self Care | Payer: Medicare Other | Attending: Emergency Medicine | Admitting: Emergency Medicine

## 2016-02-13 DIAGNOSIS — Z87891 Personal history of nicotine dependence: Secondary | ICD-10-CM | POA: Diagnosis not present

## 2016-02-13 DIAGNOSIS — Z7982 Long term (current) use of aspirin: Secondary | ICD-10-CM | POA: Diagnosis not present

## 2016-02-13 DIAGNOSIS — R51 Headache: Secondary | ICD-10-CM | POA: Insufficient documentation

## 2016-02-13 DIAGNOSIS — N183 Chronic kidney disease, stage 3 (moderate): Secondary | ICD-10-CM | POA: Diagnosis not present

## 2016-02-13 DIAGNOSIS — Z79899 Other long term (current) drug therapy: Secondary | ICD-10-CM | POA: Insufficient documentation

## 2016-02-13 DIAGNOSIS — I129 Hypertensive chronic kidney disease with stage 1 through stage 4 chronic kidney disease, or unspecified chronic kidney disease: Secondary | ICD-10-CM | POA: Diagnosis not present

## 2016-02-13 DIAGNOSIS — R519 Headache, unspecified: Secondary | ICD-10-CM

## 2016-02-13 LAB — BASIC METABOLIC PANEL
ANION GAP: 7 (ref 5–15)
BUN: 16 mg/dL (ref 6–20)
CALCIUM: 9.5 mg/dL (ref 8.9–10.3)
CO2: 28 mmol/L (ref 22–32)
CREATININE: 0.7 mg/dL (ref 0.44–1.00)
Chloride: 104 mmol/L (ref 101–111)
Glucose, Bld: 93 mg/dL (ref 65–99)
Potassium: 4.8 mmol/L (ref 3.5–5.1)
SODIUM: 139 mmol/L (ref 135–145)

## 2016-02-13 LAB — CBC WITH DIFFERENTIAL/PLATELET
BASOS ABS: 0.1 10*3/uL (ref 0–0.1)
BASOS PCT: 1 %
EOS ABS: 0 10*3/uL (ref 0–0.7)
Eosinophils Relative: 0 %
HCT: 40.8 % (ref 35.0–47.0)
Hemoglobin: 13.6 g/dL (ref 12.0–16.0)
Lymphocytes Relative: 23 %
Lymphs Abs: 2.1 10*3/uL (ref 1.0–3.6)
MCH: 28 pg (ref 26.0–34.0)
MCHC: 33.2 g/dL (ref 32.0–36.0)
MCV: 84.3 fL (ref 80.0–100.0)
MONO ABS: 0.5 10*3/uL (ref 0.2–0.9)
MONOS PCT: 6 %
NEUTROS ABS: 6.2 10*3/uL (ref 1.4–6.5)
Neutrophils Relative %: 70 %
PLATELETS: 275 10*3/uL (ref 150–440)
RBC: 4.84 MIL/uL (ref 3.80–5.20)
RDW: 15.7 % — AB (ref 11.5–14.5)
WBC: 8.9 10*3/uL (ref 3.6–11.0)

## 2016-02-13 LAB — SEDIMENTATION RATE: SED RATE: 22 mm/h (ref 0–30)

## 2016-02-13 MED ORDER — ACETAMINOPHEN 325 MG PO TABS
ORAL_TABLET | ORAL | Status: AC
  Administered 2016-02-13: 650 mg via ORAL
  Filled 2016-02-13: qty 2

## 2016-02-13 MED ORDER — ACETAMINOPHEN 325 MG PO TABS
650.0000 mg | ORAL_TABLET | Freq: Once | ORAL | Status: AC
  Administered 2016-02-13: 650 mg via ORAL

## 2016-02-13 NOTE — Telephone Encounter (Signed)
Tried to call patient,  Patients mailbox if ull so I was unable to leave message.

## 2016-02-13 NOTE — Discharge Instructions (Signed)
Your workup today was reassuring with normal lab work (including a normal sedimentation rate) and a normal head CT.  We recommend you continue taking over-the-counter medication such as ibuprofen and Tylenol according to label instructions and follow-up with your regular doctor at the next available opportunity.  Return to the emergency department if you develop new or worsening symptoms that concern you.

## 2016-02-13 NOTE — Telephone Encounter (Signed)
Called patients Nurse Baily @ [phone].  Instructed her to let patient know that PCP stated she would need to go to ED due to SX and HX.

## 2016-02-13 NOTE — ED Notes (Signed)
Patient was referred to ED due to a measured spO2 of 75% on room air during an assessment prior to a Physical therapy session. Pt denies SOB or chest pain. Saturation currently 100% on 2L via nasal cannula.

## 2016-02-13 NOTE — Telephone Encounter (Signed)
Patient has not worn her oxygen for awhile per the physical therapist. Her stats today were starting at 75 on room air. Once she put on the oxygen and queued to take deep breaths, it came back up to 97. Her blood pressure is 150/98 and heart rate of 112n sitting still. *pt was transferred to the nurse line.

## 2016-02-13 NOTE — ED Triage Notes (Signed)
Daughter reports pt takes excedrin to sleep at night.

## 2016-02-13 NOTE — Telephone Encounter (Signed)
The patient was also confused and had a headache. Fredric MareBailey from [facility] [employer] informed the family that she would call the office and if the patient got any worse to take the patient to the ER.

## 2016-02-13 NOTE — ED Provider Notes (Signed)
[facility] Emergency Department Provider Note  ____________________________________________   First MD Initiated Contact with Patient 02/13/16 1825     (approximate)  I have reviewed the triage vital signs and the nursing notes.   HISTORY  Chief Complaint Migraine    HPI Julia Huffman is a 81 y.o. female with a history of pulmonary fibrosis with chronic home O2 but no reported history of headaches or migraines who presents for evaluation of headache.  She reports that the headaches of being going on for quite some time although they have been more noticeable in the last 3 days.  They are intermittent, mild to moderate in intensity, nothing in particular makes it better nor worse, and are located and bilateral temples.  It is a throbbing, sharp and stabbing pain that she can feel in the sides of her face and behind her eyes.  She has not had any numbness, tingling, nor weakness in her extremities and has had no balance difficulties.  She has had no vision changes and her last prescription eyeglasses Sam was about 2 years ago.  She denies nausea, vomiting, chest pain, shortness of breath, abdominal pain, dysuria.  Her home health nurse noted that she was hypoxemic earlier today but she is not using her oxygen and in the emergency department she is on 2 L satting 100%.   Past Medical History:  Diagnosis Date  . Arthritis   . Chicken pox   . Chronic kidney disease   . History of vasculitis    History of Microscopic polyangitis  . Hypertension   . Osteoporosis   . Pul[facility](HCC)     Patient Active Problem List   Diagnosis Date Noted  . CKD (chronic kidney disease) stage 3, GFR 30-59 ml/min 01/23/2016  . Hoarseness 01/23/2016  . Dysphagia 01/23/2016  . Osteoporosis 01/22/2016  . Postinflammatory pulmonary fibrosis (HCC) 01/15/2016  . Chronic respiratory failure (HCC) 01/15/2016    Past Surgical History:  Procedure Laterality Date  .  BREAST SURGERY    . TOTAL HIP ARTHROPLASTY      Prior to Admission medications   Medication Sig Start Date End Date Taking? Authorizing Provider  aspirin EC 81 MG tablet Take 81 mg by mouth daily.    Historical Provider, MD  ranitidine (ZANTAC) 150 MG tablet Take 1 tablet (150 mg total) by mouth 2 (two) times daily. 01/04/16   Adrian SaranSital Mody, MD    Allergies Review of patient's allergies indicates no known allergies.  Family History  Problem Relation Age of Onset  . Heart attack Mother   . Arthritis Mother   . Heart disease Mother   . Hypertension Mother     Social History Social History  Substance Use Topics  . Smoking status: Former Smoker    Packs/day: 0.25    Years: 5.00    Types: Cigarettes    Quit date: 01/22/1991  . Smokeless tobacco: Never Used  . Alcohol use No    Review of Systems Constitutional: No fever/chills Eyes: No visual changes. ENT: No sore throat. Cardiovascular: Denies chest pain. Respiratory: Denies shortness of breath.  Chronic pulmonary fibrosis Gastrointestinal: No abdominal pain.  No nausea, no vomiting.  No diarrhea.  No constipation. Genitourinary: Negative for dysuria. Musculoskeletal: Negative for back pain. Skin: Negative for rash. Neurological: The lateral temporal headaches, episodic  10-point ROS otherwise negative.  ____________________________________________   PHYSICAL EXAM:  VITAL SIGNS: ED Triage Vitals  Enc Vitals Group     BP 02/13/16 1521 Marland Kitchen(!)  149/99     Pulse Rate 02/13/16 1521 (!) 103     Resp 02/13/16 1521 (!) 24     Temp 02/13/16 1521 97.9 F (36.6 C)     Temp Source 02/13/16 1521 Oral     SpO2 02/13/16 1521 100 %     Weight 02/13/16 1521 100 lb (45.4 kg)     Height 02/13/16 1521 5\' 5"  (1.651 m)     Head Circumference --      Peak Flow --      Pain Score 02/13/16 1522 5     Pain Loc --      Pain Edu? --      Excl. in GC? --     Constitutional: Alert and oriented. Well appearing and in no acute  distress. Eyes: Conjunctivae are normal. PERRL. EOMI. No papilledema on funduscopic exam. Head: Atraumatic.  Very mild tenderness to palpation of bilateral temples. Nose: No congestion/rhinnorhea. Mouth/Throat: Mucous membranes are moist.  Oropharynx non-erythematous. Neck: No stridor.  No meningeal signs.   Cardiovascular: Normal rate, regular rhythm. Good peripheral circulation. Grossly normal heart sounds.   Respiratory: Normal respiratory effort.  No retractions. Lungs CTAB. Gastrointestinal: Soft and nontender. No distention.  Musculoskeletal: No lower extremity tenderness nor edema. No gross deformities of extremities. Neurologic:  Normal speech and language. No gross focal neurologic deficits are appreciated.  Skin:  Skin is warm, dry and intact. No rash noted. Psychiatric: Mood and affect are normal. Speech and behavior are normal.  ____________________________________________   LABS (all labs ordered are listed, but only abnormal results are displayed)  Labs Reviewed  CBC WITH DIFFERENTIAL/PLATELET - Abnormal; Notable for the following:       Result Value   RDW 15.7 (*)    All other components within normal limits  BASIC METABOLIC PANEL  SEDIMENTATION RATE   ____________________________________________  EKG  None ____________________________________________  RADIOLOGY   Ct Head Wo Contrast  Result Date: 02/13/2016 CLINICAL DATA:  Initial evaluation for worsening episodic by temporal headaches. EXAM: CT HEAD WITHOUT CONTRAST TECHNIQUE: Contiguous axial images were obtained from the base of the skull through the vertex without intravenous contrast. COMPARISON:  None. FINDINGS: Scalp soft tissues demonstrate no acute abnormality. No acute abnormality about the globes and orbits. Patient is status post lens extraction bilaterally. Visualized paranasal sinuses are clear.  No mastoid effusion. Calvarium intact. Diffuse prominence of the CSF containing spaces compatible with  generalized cerebral atrophy. Patchy and confluent hypodensity within the periventricular and deep white matter both cerebral hemispheres most consistent with chronic small vessel ischemic disease, moderate nature. Scattered vascular calcifications present within the carotid siphons. No acute intracranial hemorrhage identified. Subcentimeter hyperdensity within the subcortical white matter of the anterior inferior right frontal lobe most consistent with a small parenchymal calcification, of doubtful clinical significance. No evidence for acute large vessel territory infarct. Gray-white matter differentiation maintained. Deep gray nuclei maintained. No hyperdense vessel identified. No mass lesion, midline shift, or mass effect. No hydrocephalus. No extra-axial fluid collection. Go IMPRESSION: 1. No acute intracranial process. 2. Age-related cerebral atrophy with moderate chronic microvascular ischemic disease. Electronically Signed   By: Rise MuBenjamin  McClintock M.D.   On: 02/13/2016 19:53    ____________________________________________   PROCEDURES  Procedure(s) performed:   Procedures   Critical Care performed: No ____________________________________________   INITIAL IMPRESSION / ASSESSMENT AND PLAN / ED COURSE  Pertinent labs & imaging results that were available during my care of the patient were reviewed by me and considered in my medical  decision making (see chart for details).  Temporal arteritis is possible but I believe unlikely based on the episodic/intermittent nature of the symptoms and the fact she is only very minimally tender to palpation as well as the fact it is bilateral.  It could be that she is having headaches due to needing an update of her eyeglasses or she may simply be having unspecified episodic non-intractable headache.  Her daughter is present and we all agreed we would avoid any medication at this time given her age and the fact that we may cause more problems than  help.  She is in no acute distress.  I will check a sedimentation rate and basic labs including a metabolic panel and CBC.  I will also obtain a CT scan of her head.  I anticipate that she will be able to follow up as an outpatient.  Clinical Course  Comment By Time  The patient continues to be in no acute distress and is watching TV comfortably.  Her CT head and lab work is all unremarkable with no suggestion of temporal arteritis or other acute/emergent medical condition causing her headache.  As per her prior discussion and recommendations, she can follow-up as an outpatient and continue take over-the-counter medication for her headache at this time.I gave my usual and customary return precautions.  Loleta Rose, MD 08/17 2018    ____________________________________________  FINAL CLINICAL IMPRESSION(S) / ED DIAGNOSES  Final diagnoses:  Nonintractable episodic headache, unspecified headache type     MEDICATIONS GIVEN DURING THIS VISIT:  Medications - No data to display   NEW OUTPATIENT MEDICATIONS STARTED DURING THIS VISIT:  New Prescriptions   No medications on file      Note:  This document was prepared using Dragon voice recognition software and may include unintentional dictation errors.    Loleta Rose, MD 02/13/16 2021

## 2016-02-13 NOTE — ED Triage Notes (Addendum)
Pt has had headaches "every day" but pt cannot report how long. Has been going on for long time per daughter. Low sats at home per home health nurse. sats 100 % on 2 L [place] in triage. Pt has hx pulmonary fibrosis and wears O2 PRN. Pt c/o headache to temples at this time. No other complaints. Pt denies any SHOB. Only would like to be seen for headaches.

## 2016-02-18 ENCOUNTER — Telehealth: Payer: Self-pay | Admitting: *Deleted

## 2016-02-18 NOTE — Telephone Encounter (Signed)
Scheduled

## 2016-02-18 NOTE — Telephone Encounter (Signed)
Schedule 1115 on Thursday for ER follow up.

## 2016-02-18 NOTE — Telephone Encounter (Signed)
Patients daughter requested to have pt seen by Dr. Adriana Simasook for a ER follow up on tomorrow or Friday. Please give a time and date to schedule

## 2016-02-20 ENCOUNTER — Encounter: Payer: Self-pay | Admitting: Family Medicine

## 2016-02-20 ENCOUNTER — Ambulatory Visit (INDEPENDENT_AMBULATORY_CARE_PROVIDER_SITE_OTHER): Payer: Medicare Other | Admitting: Family Medicine

## 2016-02-20 DIAGNOSIS — R519 Headache, unspecified: Secondary | ICD-10-CM | POA: Insufficient documentation

## 2016-02-20 DIAGNOSIS — R51 Headache: Secondary | ICD-10-CM

## 2016-02-20 MED ORDER — INDOMETHACIN 50 MG PO CAPS: 50.0000 mg | ORAL_CAPSULE | Freq: Three times a day (TID) | ORAL | 0 refills | Status: DC | PRN

## 2016-02-20 NOTE — Assessment & Plan Note (Signed)
New problem. Has had an unremarkable workup and no concerning symptoms or red flags. Brief trial of indomethacin as sounds like primary stabbing headache.

## 2016-02-20 NOTE — Patient Instructions (Signed)
Use the medication as needed.  If it persists we can consider sending you to a neurologist.   Take care  Dr. Adriana Simasook

## 2016-02-20 NOTE — Progress Notes (Signed)
   Subjective:  Patient ID: Julia Huffman, female    DOB: 09/18/1934  Age: 81 y.o. MRN: [id number]  CC: ED follow up  HPI:  81 year old female with end-stage pulmonary fibrosis presents for ED follow-up.  Patient was recently seen in the ED on 8/17 with complaints of headache. Patient had an unremarkable workup including laboratory studies and CT.  Patient resents today for follow-up regarding this. Patient states that she continues to have intermittent bitemporal headache. She describes it as sharp/stabbing. She has taken Advil with improvement. No known exacerbating factors. Of note, she states that she has had this type of headache previously. No associated nausea or photophobia. No phonophobia. No focal neurological deficits or changes in vision. She states that the headaches are moderate to severe at times. No other complaints at this time.  Social Hx   Social History   Social History  . Marital status: Widowed    Spouse name: N/A  . Number of children: N/A  . Years of education: N/A   Social History Main Topics  . Smoking status: Former Smoker    Packs/day: 0.25    Years: 5.00    Types: Cigarettes    Quit date: 01/22/1991  . Smokeless tobacco: Never Used  . Alcohol use No  . Drug use: No  . Sexual activity: Not Asked   Other Topics Concern  . None   Social History Narrative  . None   Review of Systems  Respiratory: Positive for shortness of breath.   Neurological: Positive for headaches.   Objective:  BP (!) 162/101 (BP Location: Left Arm, Patient Position: Sitting, Cuff Size: Small)   Pulse (!) 108   Wt 96 lb 4 oz (43.7 kg)   SpO2 97%   BMI 16.02 kg/m   BP/Weight 02/20/2016 02/13/2016 01/22/2016  Systolic BP 162 180 130  Diastolic BP 101 103 85  Wt. (Lbs) 96.25 100 100.5  BMI 16.02 16.64 17.39   Physical Exam  Constitutional: She is oriented to person, place, and time.  Frail, cachectic elderly female in no acute distress.  Cardiovascular: Regular  rhythm.  Tachycardia present.   Pulmonary/Chest:  Diffuse coarse crackles.  Neurological: She is alert and oriented to person, place, and time.  No focal deficits.  Psychiatric: She has a normal mood and affect.  Vitals reviewed.  Lab Results  Component Value Date   WBC 8.9 02/13/2016   HGB 13.6 02/13/2016   HCT 40.8 02/13/2016   PLT 275 02/13/2016   GLUCOSE 93 02/13/2016   NA 139 02/13/2016   K 4.8 02/13/2016   CL 104 02/13/2016   CREATININE 0.70 02/13/2016   BUN 16 02/13/2016   CO2 28 02/13/2016    Assessment & Plan:   Problem List Items Addressed This Visit    Headache    New problem. Has had an unremarkable workup and no concerning symptoms or red flags. Brief trial of indomethacin as sounds like primary stabbing headache.      Relevant Medications   indomethacin (INDOCIN) 50 MG capsule    Other Visit Diagnoses   None.     Meds ordered this encounter  Medications  . indomethacin (INDOCIN) 50 MG capsule    Sig: Take 1 capsule (50 mg total) by mouth 3 (three) times daily as needed.    Dispense:  30 capsule    Refill:  0    Follow-up: PRN  Everlene OtherJayce Kirby Cortese DO [facility] [place]

## 2016-02-25 ENCOUNTER — Telehealth: Payer: Self-pay | Admitting: Family Medicine

## 2016-02-25 NOTE — Telephone Encounter (Signed)
Noted, thanks!

## 2016-02-25 NOTE — Telephone Encounter (Signed)
Please advise, thanks.

## 2016-02-25 NOTE — Telephone Encounter (Signed)
I'm okay with those numbers. If tachycardia and elevated BP persists, she should be seen.

## 2016-02-25 NOTE — Telephone Encounter (Signed)
[name] [phone] called from [facility] regarding pt vitals Resting heart rate 110-120. BP 150/90 at rest BP after treatment at rest 165/105. Pt had a splitting headache in the am pt took medication and after no pain. Thank you!

## 2016-02-27 ENCOUNTER — Telehealth: Payer: Self-pay | Admitting: Family Medicine

## 2016-02-27 NOTE — Telephone Encounter (Signed)
Please advise, thanks.

## 2016-02-27 NOTE — Telephone Encounter (Signed)
Those vitals are fine. No need for intervention at this time.

## 2016-02-27 NOTE — Telephone Encounter (Signed)
noted 

## 2016-02-27 NOTE — Telephone Encounter (Signed)
Mikle BosworthCarlos [phone] called from [facility] regarding pt heart rate is 102 at rest today. BP 148/90. Thank you!

## 2016-02-28 ENCOUNTER — Other Ambulatory Visit: Payer: Self-pay | Admitting: Family Medicine

## 2016-02-28 ENCOUNTER — Telehealth: Payer: Self-pay | Admitting: *Deleted

## 2016-02-28 MED ORDER — HYDROCHLOROTHIAZIDE 12.5 MG PO CAPS: 12.5000 mg | ORAL_CAPSULE | Freq: Every day | ORAL | 0 refills | Status: DC

## 2016-02-28 NOTE — Telephone Encounter (Signed)
Crystal stated that patients BP has been consistently higher.  Asking if Dr. Adriana Simasook could put in a low dose of blood pressure medication.  Also asking about having Patient moving towards constant oxygen with humidification.. Patient has been using her oxygen more frequently and the oxygen is irritating her nares.  Crystal also wanted to talk about patients feet both are swelling +1-+2 the ankle down.  Wondering if a compression sock will help with SX. Please advise.

## 2016-02-28 NOTE — Telephone Encounter (Signed)
Please advise 

## 2016-02-28 NOTE — Telephone Encounter (Signed)
Please contact them and find out what they want.

## 2016-02-28 NOTE — Telephone Encounter (Signed)
Crystal johnson from [facility] has requested a call from Dr.Cook to discus patients blood pressure, oxygen use and edema in pt's lower limbs.  [employer]rystal Johnson  [phone]

## 2016-02-28 NOTE — Telephone Encounter (Signed)
I have sent in an Rx for HCTZ. She needs to follow up with me regarding her BP's. I'm okay with humidified oxygen and compression stockings.

## 2016-03-03 NOTE — Telephone Encounter (Signed)
Unable top leave message due to mailbox being full.  Will try calling tomorrow.

## 2016-03-04 ENCOUNTER — Telehealth: Payer: Self-pay | Admitting: Family Medicine

## 2016-03-04 NOTE — Telephone Encounter (Signed)
Pt's daughter dropped off paper work to be filled out for in home oxygen. Paper work is up front in Product/process development scientistcolored folder.

## 2016-03-04 NOTE — Telephone Encounter (Signed)
Would pulmonology be better or do you want to see her?

## 2016-03-04 NOTE — Telephone Encounter (Signed)
Ok. I called daughter and she will drop paperwork back off.

## 2016-03-04 NOTE — Telephone Encounter (Signed)
I can fill it out

## 2016-03-04 NOTE — Telephone Encounter (Signed)
Pt daughter came in needing pulse oximetry form to be filled out for [facility] within the next 2 days. Please advise?  [facility] @ [phone]. Thank you!

## 2016-03-04 NOTE — Telephone Encounter (Signed)
No appointment needed per- PCP. Pt can drop paperwork off at the office and PCP will complete.

## 2016-03-04 NOTE — Telephone Encounter (Signed)
Will you need a appointment or have them drop paperwork off?

## 2016-03-04 NOTE — Telephone Encounter (Signed)
Pt needs to contact pulmonology about paperwork.

## 2016-03-05 ENCOUNTER — Ambulatory Visit: Payer: Medicare Other | Admitting: Family Medicine

## 2016-03-05 NOTE — Telephone Encounter (Signed)
Left message for patient to return call back. To inform patient about medication.

## 2016-03-05 NOTE — Telephone Encounter (Signed)
Ok

## 2016-03-10 NOTE — Telephone Encounter (Signed)
Crystal has been informed of medication, stockings, and oxygen

## 2016-04-14 ENCOUNTER — Encounter: Payer: Self-pay | Admitting: Internal Medicine

## 2016-04-14 ENCOUNTER — Ambulatory Visit (INDEPENDENT_AMBULATORY_CARE_PROVIDER_SITE_OTHER): Payer: Medicare Other | Admitting: Internal Medicine

## 2016-04-14 ENCOUNTER — Telehealth: Payer: Self-pay | Admitting: Family Medicine

## 2016-04-14 VITALS — BP 128/82 | HR 63 | Ht 65.0 in | Wt 97.6 lb

## 2016-04-14 DIAGNOSIS — J9611 Chronic respiratory failure with hypoxia: Secondary | ICD-10-CM | POA: Diagnosis not present

## 2016-04-14 DIAGNOSIS — F0390 Unspecified dementia without behavioral disturbance: Secondary | ICD-10-CM | POA: Diagnosis not present

## 2016-04-14 NOTE — Progress Notes (Signed)
[facility] Pulmonary Medicine Consultation      Date: 04/14/2016,   MRN# [id number] Julia Huffman Dec 08, 1934 Code Status:  Code Status History    Date Active Date Inactive Code Status Order ID Comments User Context   01/02/2016  8:03 PM 01/04/2016  5:18 PM Full Code [id number]  Houston Siren, MD Inpatient     Hosp day:@LENGTHOFSTAYDAYS @ Referring MD: @ATDPROV @     PCP:      AdmissionWeight: 97 lb 9.6 oz (44.3 kg)                 CurrentWeight: 97 lb 9.6 oz (44.3 kg) Julia Huffman is a 81 y.o. old female seen for hospital follow up  For fibrosis      CHIEF COMPLAINT:   SOB   HISTORY OF PRESENT ILLNESS  81 y.o. female with a known history of pulmonary fibrosis,  Has chronic  shortness of breath/DOE   -She has been feeling short of breath now for the past to several months and progressively getting worse.  - oxygen at home. 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  acetaminophen (TYLENOL) 500 MG tablet, Take 500 mg by mouth every 6 (six) hours as needed., Disp: , Rfl:  .  aspirin EC 81 MG tablet, Take 81 mg by mouth daily., Disp: , Rfl:     ALLERGIES   Review of patient's allergies indicates no known allergies.     REVIEW OF SYSTEMS   Review of Systems  Constitutional: Positive for malaise/fatigue. Negative for chills, diaphoresis, fever and weight loss.  HENT: Negative for congestion and hearing loss.   Eyes: Negative for blurred vision and double vision.  Respiratory: Positive for shortness of breath. Negative for cough, hemoptysis, sputum production and wheezing.   Cardiovascular: Negative for chest pain, palpitations, orthopnea and leg swelling.  Gastrointestinal: Negative for heartburn, nausea and vomiting.    Skin: Negative for rash.  Neurological: Negative for weakness and headaches.  All other systems reviewed and are negative.    VS: BP 128/82 (BP Location: Left Arm, Cuff Size: Normal)   Pulse 63   Ht 5\' 5"  (1.651 m)   Wt 97 lb 9.6 oz (44.3 kg)   SpO2 90%   BMI 16.24 kg/m      PHYSICAL EXAM  Physical Exam  Constitutional: She is oriented to person, place, and time. No distress.  Eyes: No scleral icterus.  Neck: Neck supple.  Cardiovascular: Normal rate, regular rhythm and normal heart sounds.   No murmur heard. Pulmonary/Chest: No stridor. No respiratory distress. She has no wheezes. She has rales.  Musculoskeletal: Normal range of motion. She exhibits no edema.  Neurological: She is alert and oriented to person, place, and time. No cranial nerve deficit.  Skin: Skin is warm. She is not diaphoretic.  Psychiatric: She has a normal mood and affect.           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    The Patient requires high complexity decision making for assessment and support, frequent evaluation and titration of therapies, application of advanced monitoring technologies and extensive interpretation of multiple databases.  Patient/Family are satisfied with Plan of action and management. All questions answered  Lucie LeatherKurian David Elton Heid, M.D.  [facility]  Medical Director [facility] Medical Director [facility]

## 2016-04-14 NOTE — Patient Instructions (Addendum)
Confirm oxygen levels Continue oxygen as needed  Pulmonary Fibrosis Pulmonary fibrosis is a type of lung disease that causes scarring. Over time, the scar tissue (fibrosis) builds up in the air sacs of your lungs. This makes it hard for you to breathe. Less oxygen can get into your blood. Scarring from pulmonary fibrosis gets worse over time. This damage is permanent. Having damaged lungs may make it more likely that you will have heart problems as well. CAUSES Usually, the cause of pulmonary fibrosis is not known (idiopathic pulmonary fibrosis). However, pulmonary fibrosis can be caused by:   Exposure to occupational and environmental toxins. These include asbestos, silica, and metal dusts.  Inhaling moldy hay. This can cause an allergic reaction in the lung (farmer's lung) that can lead to pulmonary fibrosis.  Inhaling toxic fumes.  Certain medicines. These include drugs used in radiation therapy or used to treat seizures, heart problems, and some infections.  Autoimmune diseases, such as rheumatoid arthritis, systemic sclerosis, and connective tissue diseases.  Sarcoidosis. In this disease, areas of inflammatory cells (granulomas) form and most often affect the lungs.  Genes. Some cases of pulmonary fibrosis may be passed down through families. RISK FACTORS You may be at a higher risk for developing pulmonary fibrosis if:  You have a family history of the disease.  You have an autoimmune disease or another condition linked to pulmonary fibrosis.  You are exposed to certain substances or fumes found in agricultural, farm, Holiday representative, or factory work.  You take certain medicines. SIGNS AND SYMPTOMS Symptoms may include:  Difficulty breathing that gets worse with activity.  Dry, hacking cough.  Rapid, shallow breathing during exercise or while at rest.  Shortness of breath that gets worse (dyspnea).  Bluish skin and lips.  Loss of appetite.  Loss of  strength.  Weight loss and fatigue.  Rounded and enlarged fingertips (clubbing). DIAGNOSIS Your health care provider may suspect pulmonary fibrosis based on your symptoms and medical history. Diagnosis may include a physical exam. Your health care provider will check for signs that strongly suggest that you have pulmonary fibrosis, such as:  Blue skin around your fingernails or mouth from reduced oxygen.  Clubbing around the ends of your fingers.  A crackling sound when you breathe. Your health care provider may also do tests to confirm the diagnosis. These may include:  Looking inside your lungs with an instrument (bronchoscopy).  Imaging studies of your lungs and heart using:  X-rays.  CT scan.  Sound waves (echocardiogram).  Tests to measure how well you are breathing (pulmonary function tests).  Exercise testing to see how well your lungs work while you are walking.  Blood tests.  A procedure to remove a small piece of lung tissue to examine in a lab (biopsy). TREATMENT There is no cure for pulmonary fibrosis. Treatments focus on managing symptoms and preventing scarring from getting worse. This can include:  Medicines.  You may take steroids to prevent permanent lung changes. Your health care provider may put you on a high dose at first, then on lower dosages for the long term.  Medicines to suppress your body's defense (immune) system. These can have serious side effects.  You may be monitored with X-rays and laboratory work.  Oxygen therapy may be helpful if oxygen in your blood is low.  Surgery. In some cases, a lung transplant is an option. HOME CARE INSTRUCTIONS  Take medicines only as directed by your health care provider.  Keep your vaccinations up to date  as recommended by your health care provider.  Do not use any tobacco products, including cigarettes, chewing tobacco, or electronic cigarettes. If you need help quitting, ask your health care  provider.  Get regular exercise, but do not overexert yourself. Ask your health care provider to suggest some activities that are safe for you to do. Walking and chair exercises can help if you have physical limitations.  Consider joining a pulmonary rehabilitation program or a support group for people with pulmonary fibrosis.  Eat small meals often so you do not get too full. Overeating can make breathing trouble worse.  Maintain a healthy weight. Lose weight if you need to.  Do breathing exercises as directed by your health care provider.  Keep all follow-up visits as directed by your health care provider. This is important. SEEK MEDICAL CARE IF:  You are not able to be as active as usual.  You have a long-lasting (chronic) cough.  You are often short of breath.  You have a fever or chills. SEEK IMMEDIATE MEDICAL CARE IF:  You have chest pain.  Your breathing is much worse.  You cannot take a deep breath.  You have blue skin around your mouth or fingers.  You have clubbing of your fingers.  You cough up mucus that is dark in color.  You have a lot of headaches.  You get very confused or sleepy.   This information is not intended to replace advice given to you by your health care provider. Make sure you discuss any questions you have with your health care provider.   Document Released: 09/05/2003 Document Revised: 07/06/2014 Document Reviewed: 11/23/2013 Elsevier Interactive Patient Education [employer]2016 Elsevier Inc.

## 2016-04-14 NOTE — Telephone Encounter (Signed)
Rhonda from [facility] called. She stated that Dr. Belia HemanKasa put in a referral for pt to go to neurology for her dementia. [facility] not allow them to make the referral because of her insurance. Will you enter the referral so I can contact them to schedule an appointment.

## 2016-04-14 NOTE — Addendum Note (Signed)
Addended by: Meyer CoryAHMAD, MISTY R on: 04/14/2016 02:48 PM   Modules accepted: Orders

## 2016-04-15 ENCOUNTER — Other Ambulatory Visit: Payer: Self-pay | Admitting: Family Medicine

## 2016-04-15 ENCOUNTER — Telehealth: Payer: Self-pay | Admitting: Family Medicine

## 2016-04-15 DIAGNOSIS — R413 Other amnesia: Secondary | ICD-10-CM

## 2016-04-15 NOTE — Telephone Encounter (Signed)
I'm not sure what this is regarding? 

## 2016-04-15 NOTE — Telephone Encounter (Signed)
Julia Huffman [phone] called from [facility] regarding wanting to know if we have any start a care orders? Please advise?

## 2016-04-15 NOTE — Telephone Encounter (Signed)
Done

## 2016-04-16 NOTE — Telephone Encounter (Signed)
[place], she was discharged from [facility], and now will need new Home Health Orders for Physical therapy, nursing and home health aide . Contact Erie NoeVanessa [phone]

## 2016-04-16 NOTE — Telephone Encounter (Signed)
Orders were placed and form has been filled out.

## 2016-04-22 ENCOUNTER — Telehealth: Payer: Self-pay | Admitting: *Deleted

## 2016-04-22 NOTE — Telephone Encounter (Signed)
Lori from [facility] has requested a order for physical therapy 1 time a week for 6 weeks. She will also need a order for a  3 in 1 bedside commode on a script and faxed to [phone]

## 2016-04-23 ENCOUNTER — Encounter: Payer: Self-pay | Admitting: Family Medicine

## 2016-04-23 ENCOUNTER — Ambulatory Visit (INDEPENDENT_AMBULATORY_CARE_PROVIDER_SITE_OTHER): Payer: Medicare Other | Admitting: Family Medicine

## 2016-04-23 DIAGNOSIS — Z23 Encounter for immunization: Secondary | ICD-10-CM | POA: Diagnosis not present

## 2016-04-23 DIAGNOSIS — R51 Headache: Secondary | ICD-10-CM | POA: Diagnosis not present

## 2016-04-23 DIAGNOSIS — R519 Headache, unspecified: Secondary | ICD-10-CM

## 2016-04-23 DIAGNOSIS — R410 Disorientation, unspecified: Secondary | ICD-10-CM

## 2016-04-23 NOTE — Progress Notes (Signed)
   Subjective:  Patient ID: Julia Huffman, female    DOB: 12-Oct-1934  Age: 81 y.o. MRN: [id number]  CC: Follow up  HPI:  81 year old female with end stage pulmonary fibrosis presents for follow up.  Patient continues to have difficulty with headache. She describes this differently than documented previously. Pain is intermittent and severe. Located "between the eyes and the nose". No known exacerbating or relieving factors.  Using PRN Tylenol. No purulent nasal discharge. No other associated symptoms.  Family members report confusion and worsening memory. Often takes off O2. Appears to be declining. Pulmonologist recommended neuro referral and referral is in process.  Social Hx   Social History   Social History  . Marital status: Widowed    Spouse name: N/A  . Number of children: N/A  . Years of education: N/A   Social History Main Topics  . Smoking status: Former Smoker    Packs/day: 0.25    Years: 5.00    Types: Cigarettes    Quit date: 01/22/1991  . Smokeless tobacco: Never Used  . Alcohol use No  . Drug use: No  . Sexual activity: Not Asked   Other Topics Concern  . None   Social History Narrative  . None    Review of Systems  Respiratory: Positive for shortness of breath.   Neurological: Positive for weakness.  Psychiatric/Behavioral: Positive for confusion.   Objective:  BP 118/78   Pulse 62   Resp 18   Ht  (1.651 m)   Wt 95 lb 8 oz (43.3 kg)   BMI 15.89 kg/m   BP/Weight 04/23/2016 04/14/2016 02/20/2016  Systolic BP 118 128 162  Diastolic BP 78 82 101  Wt. (Lbs) 95.5 97.6 96.25  BMI 15.89 16.24 16.02   Physical Exam  Constitutional:  Frail, chronically ill female; NAD.   HENT:  Head: Normocephalic and atraumatic.  Maxillary sinus tenderness to palpation.  Cardiovascular: Normal rate and regular rhythm.   Pulmonary/Chest:  Diffuse crackles.  Neurological: She is alert.  Vitals reviewed.  Lab Results  Component Value Date   WBC 8.9  02/13/2016   HGB 13.6 02/13/2016   HCT 40.8 02/13/2016   PLT 275 02/13/2016   GLUCOSE 93 02/13/2016   NA 139 02/13/2016   K 4.8 02/13/2016   CL 104 02/13/2016   CREATININE 0.70 02/13/2016   BUN 16 02/13/2016   CO2 28 02/13/2016    Assessment & Plan:   Problem List Items Addressed This Visit    Headache around the eyes    New problem. Unclear etiology/prognosis. Could be simply related to chronic O2 use (irritation from nasal canula). Referral to Neuro is in process. Will see if neuro has any input. Continue PRN Tylenol.       Confusion    New problem. Suspect underlying dementia. Patient going to see Neuro.       Other Visit Diagnoses    Encounter for immunization       Relevant Orders   Flu vaccine HIGH DOSE PF (Completed)      Follow-up: Return in about 6 months (around 10/22/2016).  Everlene Other DO [facility]

## 2016-04-23 NOTE — Assessment & Plan Note (Addendum)
New problem. Suspect underlying dementia. Patient going to see Neuro.

## 2016-04-23 NOTE — Patient Instructions (Signed)
We will call regarding the neurology referral.  Follow up in 6 months.  Call with concerns.  Take care  Dr. Adriana Simasook

## 2016-04-23 NOTE — Telephone Encounter (Signed)
Orders faxed

## 2016-04-23 NOTE — Assessment & Plan Note (Signed)
New problem. Unclear etiology/prognosis. Could be simply related to chronic O2 use (irritation from nasal canula). Referral to Neuro is in process. Will see if neuro has any input. Continue PRN Tylenol.

## 2016-04-23 NOTE — Telephone Encounter (Signed)
Fax please

## 2016-04-24 ENCOUNTER — Emergency Department: Payer: Medicare Other

## 2016-04-24 ENCOUNTER — Encounter: Payer: Self-pay | Admitting: Emergency Medicine

## 2016-04-24 ENCOUNTER — Inpatient Hospital Stay
Admission: EM | Admit: 2016-04-24 | Discharge: 2016-04-27 | DRG: 101 | Disposition: A | Discharge status: Skilled Nursing Facility | Payer: Medicare Other | Attending: Internal Medicine | Admitting: Internal Medicine

## 2016-04-24 ENCOUNTER — Inpatient Hospital Stay: Payer: Medicare Other

## 2016-04-24 DIAGNOSIS — K922 Gastrointestinal hemorrhage, unspecified: Secondary | ICD-10-CM

## 2016-04-24 DIAGNOSIS — K921 Melena: Secondary | ICD-10-CM | POA: Diagnosis present

## 2016-04-24 DIAGNOSIS — Z87891 Personal history of nicotine dependence: Secondary | ICD-10-CM

## 2016-04-24 DIAGNOSIS — J84112 Idiopathic pulmonary fibrosis: Secondary | ICD-10-CM | POA: Diagnosis present

## 2016-04-24 DIAGNOSIS — N189 Chronic kidney disease, unspecified: Secondary | ICD-10-CM | POA: Diagnosis present

## 2016-04-24 DIAGNOSIS — Z96649 Presence of unspecified artificial hip joint: Secondary | ICD-10-CM | POA: Diagnosis present

## 2016-04-24 DIAGNOSIS — R569 Unspecified convulsions: Secondary | ICD-10-CM | POA: Diagnosis present

## 2016-04-24 DIAGNOSIS — I129 Hypertensive chronic kidney disease with stage 1 through stage 4 chronic kidney disease, or unspecified chronic kidney disease: Secondary | ICD-10-CM | POA: Diagnosis present

## 2016-04-24 DIAGNOSIS — J189 Pneumonia, unspecified organism: Secondary | ICD-10-CM

## 2016-04-24 DIAGNOSIS — M810 Age-related osteoporosis without current pathological fracture: Secondary | ICD-10-CM | POA: Diagnosis present

## 2016-04-24 DIAGNOSIS — Z7982 Long term (current) use of aspirin: Secondary | ICD-10-CM | POA: Diagnosis not present

## 2016-04-24 DIAGNOSIS — Z8261 Family history of arthritis: Secondary | ICD-10-CM | POA: Diagnosis not present

## 2016-04-24 DIAGNOSIS — Z8249 Family history of ischemic heart disease and other diseases of the circulatory system: Secondary | ICD-10-CM | POA: Diagnosis not present

## 2016-04-24 DIAGNOSIS — K649 Unspecified hemorrhoids: Secondary | ICD-10-CM | POA: Diagnosis present

## 2016-04-24 DIAGNOSIS — R4189 Other symptoms and signs involving cognitive functions and awareness: Secondary | ICD-10-CM | POA: Diagnosis not present

## 2016-04-24 DIAGNOSIS — K2971 Gastritis, unspecified, with bleeding: Secondary | ICD-10-CM | POA: Diagnosis not present

## 2016-04-24 DIAGNOSIS — R52 Pain, unspecified: Secondary | ICD-10-CM

## 2016-04-24 DIAGNOSIS — M1990 Unspecified osteoarthritis, unspecified site: Secondary | ICD-10-CM | POA: Diagnosis present

## 2016-04-24 LAB — BLOOD GAS, ARTERIAL
ACID-BASE EXCESS: 3.2 mmol/L — AB (ref 0.0–2.0)
Bicarbonate: 28.5 mmol/L — ABNORMAL HIGH (ref 20.0–28.0)
FIO2: 0.21
O2 SAT: 86.7 %
PATIENT TEMPERATURE: 37
PO2 ART: 52 mmHg — AB (ref 83.0–108.0)
pCO2 arterial: 45 mmHg (ref 32.0–48.0)
pH, Arterial: 7.41 (ref 7.350–7.450)

## 2016-04-24 LAB — BLOOD GAS, VENOUS
ACID-BASE DEFICIT: 8.1 mmol/L — AB (ref 0.0–2.0)
BICARBONATE: 21 mmol/L (ref 20.0–28.0)
FIO2: 1
O2 SAT: 70.2 %
PATIENT TEMPERATURE: 37
pCO2, Ven: 59 mmHg (ref 44.0–60.0)
pH, Ven: 7.16 — CL (ref 7.250–7.430)
pO2, Ven: 48 mmHg — ABNORMAL HIGH (ref 32.0–45.0)

## 2016-04-24 LAB — URINE DRUG SCREEN, QUALITATIVE (ARMC ONLY)
Amphetamines, Ur Screen: NOT DETECTED
BARBITURATES, UR SCREEN: NOT DETECTED
BENZODIAZEPINE, UR SCRN: POSITIVE — AB
CANNABINOID 50 NG, UR ~~LOC~~: NOT DETECTED
Cocaine Metabolite,Ur ~~LOC~~: NOT DETECTED
MDMA (Ecstasy)Ur Screen: NOT DETECTED
Methadone Scn, Ur: NOT DETECTED
Opiate, Ur Screen: NOT DETECTED
Phencyclidine (PCP) Ur S: NOT DETECTED
TRICYCLIC, UR SCREEN: NOT DETECTED

## 2016-04-24 LAB — CBC WITH DIFFERENTIAL/PLATELET
BASOS ABS: 0 10*3/uL (ref 0–0.1)
BASOS PCT: 0 %
EOS ABS: 0 10*3/uL (ref 0–0.7)
EOS PCT: 0 %
HCT: 35.7 % (ref 35.0–47.0)
Hemoglobin: 11.9 g/dL — ABNORMAL LOW (ref 12.0–16.0)
Lymphocytes Relative: 14 %
Lymphs Abs: 1.4 10*3/uL (ref 1.0–3.6)
MCH: 28.3 pg (ref 26.0–34.0)
MCHC: 33.3 g/dL (ref 32.0–36.0)
MCV: 84.8 fL (ref 80.0–100.0)
Monocytes Absolute: 0.4 10*3/uL (ref 0.2–0.9)
Monocytes Relative: 4 %
Neutro Abs: 8.1 10*3/uL — ABNORMAL HIGH (ref 1.4–6.5)
Neutrophils Relative %: 82 %
PLATELETS: 258 10*3/uL (ref 150–440)
RBC: 4.21 MIL/uL (ref 3.80–5.20)
RDW: 15.4 % — ABNORMAL HIGH (ref 11.5–14.5)
WBC: 9.9 10*3/uL (ref 3.6–11.0)

## 2016-04-24 LAB — URINALYSIS COMPLETE WITH MICROSCOPIC (ARMC ONLY)
Bacteria, UA: NONE SEEN
Bilirubin Urine: NEGATIVE
Glucose, UA: 150 mg/dL — AB
HGB URINE DIPSTICK: NEGATIVE
Ketones, ur: NEGATIVE mg/dL
Leukocytes, UA: NEGATIVE
NITRITE: NEGATIVE
PROTEIN: 30 mg/dL — AB
SPECIFIC GRAVITY, URINE: 1.005 (ref 1.005–1.030)
Squamous Epithelial / LPF: NONE SEEN
pH: 7 (ref 5.0–8.0)

## 2016-04-24 LAB — COMPREHENSIVE METABOLIC PANEL
ALBUMIN: 3.4 g/dL — AB (ref 3.5–5.0)
ALT: 30 U/L (ref 14–54)
AST: 70 U/L — AB (ref 15–41)
Alkaline Phosphatase: 69 U/L (ref 38–126)
Anion gap: 15 (ref 5–15)
BUN: 14 mg/dL (ref 6–20)
CHLORIDE: 103 mmol/L (ref 101–111)
CO2: 21 mmol/L — AB (ref 22–32)
CREATININE: 0.86 mg/dL (ref 0.44–1.00)
Calcium: 8.8 mg/dL — ABNORMAL LOW (ref 8.9–10.3)
GFR calc non Af Amer: >60 mL/min (ref >60)
Glucose, Bld: 165 mg/dL — ABNORMAL HIGH (ref 65–99)
Potassium: 3.2 mmol/L — ABNORMAL LOW (ref 3.5–5.1)
SODIUM: 139 mmol/L (ref 135–145)
Total Bilirubin: 0.6 mg/dL (ref 0.3–1.2)
Total Protein: 7 g/dL (ref 6.5–8.1)

## 2016-04-24 LAB — GLUCOSE, CAPILLARY: GLUCOSE-CAPILLARY: 92 mg/dL (ref 65–99)

## 2016-04-24 LAB — TROPONIN I: Troponin I: 0.06 ng/mL (ref <0.03)

## 2016-04-24 MED ORDER — SODIUM CHLORIDE 0.9 % IV SOLN: 1000.0000 mg | Freq: Three times a day (TID) | INTRAVENOUS | Status: DC

## 2016-04-24 MED ORDER — ACETAMINOPHEN 650 MG RE SUPP
650.0000 mg | Freq: Four times a day (QID) | RECTAL | Status: DC | PRN
  Administered 2016-04-24: 650 mg via RECTAL
  Filled 2016-04-24: qty 1

## 2016-04-24 MED ORDER — LABETALOL HCL 5 MG/ML IV SOLN
5.0000 mg | Freq: Once | INTRAVENOUS | Status: AC
  Administered 2016-04-24: 5 mg via INTRAVENOUS

## 2016-04-24 MED ORDER — ACETAMINOPHEN 325 MG PO TABS
650.0000 mg | ORAL_TABLET | Freq: Four times a day (QID) | ORAL | Status: DC | PRN
  Administered 2016-04-25: 650 mg via ORAL
  Filled 2016-04-24: qty 2

## 2016-04-24 MED ORDER — SODIUM CHLORIDE 0.9 % IV SOLN
1000.0000 mg | Freq: Two times a day (BID) | INTRAVENOUS | Status: DC
  Administered 2016-04-24 – 2016-04-25 (×3): 1000 mg via INTRAVENOUS
  Filled 2016-04-24 (×4): qty 10

## 2016-04-24 MED ORDER — HYDRALAZINE HCL 20 MG/ML IJ SOLN
10.0000 mg | Freq: Four times a day (QID) | INTRAMUSCULAR | Status: DC | PRN
  Administered 2016-04-24 – 2016-04-25 (×3): 10 mg via INTRAVENOUS
  Filled 2016-04-24 (×3): qty 1

## 2016-04-24 MED ORDER — LORAZEPAM 2 MG/ML IJ SOLN
1.0000 mg | Freq: Once | INTRAMUSCULAR | Status: AC
  Administered 2016-04-24: 1 mg via INTRAVENOUS

## 2016-04-24 MED ORDER — LEVOFLOXACIN IN D5W 250 MG/50ML IV SOLN
250.0000 mg | INTRAVENOUS | Status: DC
  Administered 2016-04-25: 250 mg via INTRAVENOUS
  Filled 2016-04-24: qty 50

## 2016-04-24 MED ORDER — SODIUM CHLORIDE 0.9% FLUSH
3.0000 mL | Freq: Two times a day (BID) | INTRAVENOUS | Status: DC
  Administered 2016-04-24 – 2016-04-27 (×5): 3 mL via INTRAVENOUS

## 2016-04-24 MED ORDER — POTASSIUM CHLORIDE IN NACL 40-0.9 MEQ/L-% IV SOLN
INTRAVENOUS | Status: DC
  Administered 2016-04-24 – 2016-04-26 (×4): 100 mL/h via INTRAVENOUS
  Filled 2016-04-24 (×6): qty 1000

## 2016-04-24 MED ORDER — ONDANSETRON HCL 4 MG/2ML IJ SOLN: 4.0000 mg | Freq: Four times a day (QID) | INTRAMUSCULAR | Status: DC | PRN

## 2016-04-24 MED ORDER — LABETALOL HCL 5 MG/ML IV SOLN
10.0000 mg | Freq: Once | INTRAVENOUS | Status: DC
  Administered 2016-04-24: 10 mg via INTRAVENOUS
  Filled 2016-04-24: qty 4

## 2016-04-24 MED ORDER — LEVOFLOXACIN IN D5W 500 MG/100ML IV SOLN
500.0000 mg | Freq: Once | INTRAVENOUS | Status: AC
  Administered 2016-04-24: 500 mg via INTRAVENOUS
  Filled 2016-04-24: qty 100

## 2016-04-24 MED ORDER — LORAZEPAM 2 MG/ML IJ SOLN
2.0000 mg | Freq: Once | INTRAMUSCULAR | Status: DC
  Filled 2016-04-24: qty 1

## 2016-04-24 MED ORDER — ENOXAPARIN SODIUM 30 MG/0.3ML ~~LOC~~ SOLN
30.0000 mg | SUBCUTANEOUS | Status: DC
  Administered 2016-04-24 – 2016-04-26 (×2): 30 mg via SUBCUTANEOUS
  Filled 2016-04-24 (×2): qty 0.3

## 2016-04-24 MED ORDER — ONDANSETRON HCL 4 MG PO TABS: 4.0000 mg | ORAL_TABLET | Freq: Four times a day (QID) | ORAL | Status: DC | PRN

## 2016-04-24 NOTE — ED Notes (Signed)
eeg lab ready for pt; will take her over after urine collected

## 2016-04-24 NOTE — ED Notes (Signed)
Pt taken to MRI. Pt moving around on stretcher; asked for and received verbal order from Dr. Thad Rangereynolds for 1mg  ativan; administered in MRI suite.

## 2016-04-24 NOTE — Progress Notes (Signed)
Patient's daughter, Lanell Mat[facility]Jacqueline [facility], at bedside requesting results from MRI. Dr. Allena KatzPatel paged and stated he will call into patient's room phone to go over results.

## 2016-04-24 NOTE — H&P (Signed)
[place] [facility] [place] NAME: Julia Huffman    MR#:  [id number]  DATE OF BIRTH:  14-Nov-1934  DATE OF ADMISSION:  04/24/2016  PRIMARY CARE PHYSICIAN: Coral Spikes, DO   REQUESTING/REFERRING PHYSICIAN:   CHIEF COMPLAINT:   Chief Complaint  Patient presents with  . Seizures    HISTORY OF PRESENT ILLNESS: Julia Huffman  is a 81 y.o. female with a known history of Vasculitis, pulmonary fibrosis, chronic kidney disease and hypertension who is brought in by family due to decrease in responsiveness. And seizure. Patient's daughter reports that earlier this morning she went to check on her and she noticed that the patient had a glazed look eyes were semi-opened. Therefore the fire department and EMS was called. When fire department got there they noticed that she had a seizure activity.  Subsequently EMS came to check on her. And she was noticed to have further seizures on route. Patient was given 2.5 mg of Versed. Currently she is drowsy. Patient was seen by family medicine yesterday with complaint of headache. According to the daughter patient has been in her usual state of health does not complain of shortness of breath no chest pains or palpitations has not had fevers  PAST MEDICAL HISTORY:   Past Medical History:  Diagnosis Date  . Arthritis   . Chicken pox   . Chronic kidney disease   . History of vasculitis    History of Microscopic polyangitis  . Hypertension   . Osteoporosis   . Pulmonary fibrosis ([place])     PAST SURGICAL HISTORY: Past Surgical History:  Procedure Laterality Date  . BREAST SURGERY    . TOTAL HIP ARTHROPLASTY      SOCIAL HISTORY:  Social History  Substance Use Topics  . Smoking status: Former Smoker    Packs/day: 0.25    Years: 5.00    Types: Cigarettes    Quit date: 01/22/1991  . Smokeless tobacco: Never Used  . Alcohol use No    FAMILY HISTORY:  Family History  Problem Relation Age of Onset  .  Heart attack Mother   . Arthritis Mother   . Heart disease Mother   . Hypertension Mother     DRUG ALLERGIES: No Known Allergies  REVIEW OF SYSTEMS:   CONSTITUTIONAL: Unable to provide   MEDICATIONS AT HOME:  Prior to Admission medications   Medication Sig Start Date End Date Taking? Authorizing Provider  acetaminophen (TYLENOL) 500 MG tablet Take 500 mg by mouth every 6 (six) hours as needed.    Historical Provider, MD  aspirin EC 81 MG tablet Take 81 mg by mouth daily.    Historical Provider, MD      PHYSICAL EXAMINATION:   VITAL SIGNS: Blood pressure (!) 191/117, pulse (!) 105, temperature 98.4 F (36.9 C), temperature source Axillary, resp. rate (!) 22, SpO2 100 %.  GENERAL:  81 y.o.-year-old patient lying in the bed with no acute distress.  EYES: Pupils equal, round, reactive to light and accommodation. No scleral icterus. Extraocular muscles intact.  HEENT: Head atraumatic, normocephalic. Oropharynx and nasopharynx clear.  NECK:  Supple, no jugular venous distention. No thyroid enlargement, no tenderness.  LUNGS: Normal breath sounds bilaterally, no wheezing, rales,rhonchi or crepitation. No use of accessory muscles of respiration.  CARDIOVASCULAR: S1, S2 normal. No murmurs, rubs, or gallops.  ABDOMEN: Soft, nontender, nondistended. Bowel sounds present. No organomegaly or mass.  EXTREMITIES: No pedal edema, cyanosis, or clubbing.  NEUROLOGIC: Cranial nerves  II through XII are intact. Muscle strength 5/5 in all extremities. Sensation intact. Gait not checked.  PSYCHIATRIC: The patient is alert and oriented x 3.  SKIN: No obvious rash, lesion, or ulcer.   LABORATORY PANEL:   CBC  Recent Labs Lab 04/24/16 0956  WBC 9.9  HGB 11.9*  HCT 35.7  PLT 258  MCV 84.8  MCH 28.3  MCHC 33.3  RDW 15.4*  LYMPHSABS 1.4  MONOABS 0.4  EOSABS 0.0  BASOSABS 0.0    ------------------------------------------------------------------------------------------------------------------  Chemistries   Recent Labs Lab 04/24/16 0956  NA 139  K 3.2*  CL 103  CO2 21*  GLUCOSE 165*  BUN 14  CREATININE 0.86  CALCIUM 8.8*  AST 70*  ALT 30  ALKPHOS 69  BILITOT 0.6   ------------------------------------------------------------------------------------------------------------------ estimated creatinine clearance is 36.9 mL/min (by C-G formula based on SCr of 0.86 mg/dL). ------------------------------------------------------------------------------------------------------------------ No results for input(s): TSH, T4TOTAL, T3FREE, THYROIDAB in the last 72 hours.  Invalid input(s): FREET3   Coagulation profile No results for input(s): INR, PROTIME in the last 168 hours. ------------------------------------------------------------------------------------------------------------------- No results for input(s): DDIMER in the last 72 hours. -------------------------------------------------------------------------------------------------------------------  Cardiac Enzymes  Recent Labs Lab 04/24/16 0956  TROPONINI 0.06*   ------------------------------------------------------------------------------------------------------------------ Invalid input(s): POCBNP  ---------------------------------------------------------------------------------------------------------------  Urinalysis No results found for: COLORURINE, APPEARANCEUR, LABSPEC, PHURINE, GLUCOSEU, HGBUR, BILIRUBINUR, KETONESUR, PROTEINUR, UROBILINOGEN, NITRITE, LEUKOCYTESUR   RADIOLOGY: Ct Head Wo Contrast  Result Date: 04/24/2016 CLINICAL DATA:  Headache, unresponsive EXAM: CT HEAD WITHOUT CONTRAST TECHNIQUE: Contiguous axial images were obtained from the base of the skull through the vertex without intravenous contrast. COMPARISON:  02/13/2016 FINDINGS: Brain: No intracranial  hemorrhage, mass effect or midline shift. No acute cortical infarction. No mass lesion is noted on this unenhanced scan. Stable atrophy and chronic white matter disease. Vascular: Atherosclerotic calcifications of carotid siphon Skull: No skull fracture. Sinuses/Orbits: Paranasal sinuses and mastoid air cells are unremarkable. Other: None IMPRESSION: No acute intracranial abnormality. No definite acute cortical infarction. Stable atrophy and chronic white matter disease. Electronically Signed   By: Lahoma Crocker M.D.   On: 04/24/2016 09:52    EKG: Orders placed or performed during the hospital encounter of 01/02/16  . EKG 12-Lead  . EKG 12-Lead  . EKG    IMPRESSION AND PLAN: Patient is a 81 year old African-American female brought in for 3 seizures  1. Seizures Etiology unclear I will obtain ABG make sure she didn't have hypoxia Seen by neurology due a MRI of the brain EEG has been ordered Keppra thousand milligrams IV every 8  2. History of vasculitis I'll check [place] ESR  3. Pulmonary fibrosis with intermittent oxygen use will continue oxygen therapy Chest x-ray consistent with possible bronchopneumonia treat her with Levaquin  4. Miscellaneous Lovenox for DVT prophylaxis  All the records are reviewed and case discussed with ED provider. Management plans discussed with the patient, family and they are in agreement.  CODE STATUS: Code Status History    Date Active Date Inactive Code Status Order ID Comments User Context   01/02/2016  8:03 PM 01/04/2016  5:18 PM Full Code [id number]  Henreitta Leber, MD Inpatient       TOTAL TIME TAKING CARE OF THIS PATIENT:97mnutes.    PDustin FlockM.D on 04/24/2016 at 11:23 AM  Between 7am to 6pm - Pager - [phone]  After 6pm go to [email or web address] - password [email or web address]  Office  [phone] CC: Primary care physician; JCoral Spikes DO

## 2016-04-24 NOTE — Progress Notes (Signed)
PHARMACIST - PHYSICIAN COMMUNICATION  CONCERNING:  Enoxaparin (Lovenox) for DVT Prophylaxis   RECOMMENDATION: Patient was prescribed enoxaprin 40mg  q24 hours for VTE prophylaxis.   Patients weight: 43.3 kg  Estimated Creatinine Clearance: 36.9 mL/min (by C-G formula based on SCr of 0.86 mg/dL).  [facility] Policy patient is candidate for enoxaparin 30mg  every 24 hours based on  Weight less then 45kg for female   DESCRIPTION: Pharmacy has adjusted enoxaparin dose per [facility] policy.  Patient is now receiving enoxaparin 30mg  every 24 hours.  Cher NakaiSheema Mikell Kazlauskas, PharmD Clinical Pharmacist  04/24/2016 3:44 PM

## 2016-04-24 NOTE — Consult Note (Addendum)
Reason for Consult:Unresponsive  Referring Physician: Don Perking  CC: Unresponsive  HPI: Julia Huffman is an 81 y.o. female who is unresponsive.  Patient unable to provide history and family not available at this time.  All history obtained from the chart.  On yesterday patient complained of a headache that was temporal at about 2230.  Went to bed.  Was unable to be awakened this morning.  EMS was called.  Was noted to have a seizure by the fire department and 3 were witnessed by EMS.  Patient was given 2.5mg  of Versed.  Remains unresponsive.     Patient has a history of headache.  Was seen by family medicine on yesterday with complaint that headache was somewhat different than previously.  There were also complaints of memory loss, confusion and poor O2 compliance.  BP at that time was normal.    Past Medical History:  Diagnosis Date  . Arthritis   . Chicken pox   . Chronic kidney disease   . History of vasculitis    History of Microscopic polyangitis  . Hypertension   . Osteoporosis   . Pulmonary fibrosis (HCC)     Past Surgical History:  Procedure Laterality Date  . BREAST SURGERY    . TOTAL HIP ARTHROPLASTY      Family History  Problem Relation Age of Onset  . Heart attack Mother   . Arthritis Mother   . Heart disease Mother   . Hypertension Mother     Social History:  reports that she quit smoking about 25 years ago. Her smoking use included Cigarettes. She has a 1.25 pack-year smoking history. She has never used smokeless tobacco. She reports that she does not drink alcohol or use drugs.  No Known Allergies  Medications:  I have reviewed the patient's current medications. Prior to Admission:  Prior to Admission medications   Medication Sig Start Date End Date Taking? Authorizing Provider  acetaminophen (TYLENOL) 500 MG tablet Take 500 mg by mouth every 6 (six) hours as needed.    Historical Provider, MD  aspirin EC 81 MG tablet Take 81 mg by mouth daily.    Historical  Provider, MD    Scheduled: . labetalol  10 mg Intravenous Once  . LORazepam  2 mg Intravenous Once    ROS: Unable to be obtained  Physical Examination: Blood pressure (!) 172/100, pulse (!) 112, temperature 98.4 F (36.9 C), temperature source Axillary, resp. rate (!) 28, SpO2 100 %.  HEENT-  Normocephalic, no lesions, without obvious abnormality.  Normal external eye and conjunctiva.  Normal TM's bilaterally.  Normal auditory canals and external ears. Normal external nose, mucus membranes and septum.  Normal pharynx. Cardiovascular- S1, S2 normal, pulses palpable throughout   Lungs- chest clear, no wheezing, rales, normal symmetric air entry Abdomen- soft, non-tender; bowel sounds normal; no masses,  no organomegaly Extremities- no edema Lymph-no adenopathy palpable Musculoskeletal-no joint tenderness, deformity or swelling Skin-warm and dry, no hyperpigmentation, vitiligo, or suspicious lesions  Neurological Examination Mental Status: Patient does not respond to verbal stimuli.  Does not respond to deep sternal rub.  Does not follow commands.  No verbalizations noted.  Cranial Nerves: II: patient does not respond confrontation bilaterally, pupils right 3 mm, left 3 mm,and reactive bilaterally III,IV,VI: doll's response absent bilaterally.  V,VII: corneal reflex reduced bilaterally  VIII: patient does not respond to verbal stimuli IX,X: gag reflex reduced, XI: trapezius strength unable to test bilaterally XII: tongue strength unable to test Motor: Extremities flaccid throughout.  No spontaneous movement noted.  No purposeful movements noted. Sensory: Does not respond to noxious stimuli in any extremity. Deep Tendon Reflexes:  2+ throughout with sustained clonus in the RLE and 4-5 beats of clonus in the LLE Plantars: downgoing bilaterally Cerebellar: Unable to perform   Laboratory Studies:   Basic Metabolic Panel: No results for input(s): NA, K, CL, CO2, GLUCOSE, BUN,  CREATININE, CALCIUM, MG, PHOS in the last 168 hours.  Liver Function Tests: No results for input(s): AST, ALT, ALKPHOS, BILITOT, PROT, ALBUMIN in the last 168 hours. No results for input(s): LIPASE, AMYLASE in the last 168 hours. No results for input(s): AMMONIA in the last 168 hours.  CBC: No results for input(s): WBC, NEUTROABS, HGB, HCT, MCV, PLT in the last 168 hours.  Cardiac Enzymes: No results for input(s): CKTOTAL, CKMB, CKMBINDEX, TROPONINI in the last 168 hours.  BNP: Invalid input(s): POCBNP  CBG:  Recent Labs Lab 04/24/16 0928  GLUCAP 92    Microbiology: Results for orders placed or performed during the hospital encounter of 01/02/16  Blood culture (routine x 2)     Status: None   Collection Time: 01/02/16  6:15 PM  Result Value Ref Range Status   Specimen Description BLOOD LEFT ASSIST CONTROL  Final   Special Requests BOTTLES DRAWN AEROBIC AND ANAEROBIC 7CC  Final   Culture NO GROWTH 5 DAYS  Final   Report Status 01/07/2016 FINAL  Final  Blood culture (routine x 2)     Status: None   Collection Time: 01/02/16  6:17 PM  Result Value Ref Range Status   Specimen Description BLOOD RIGHT HAND  Final   Special Requests BOTTLES DRAWN AEROBIC AND ANAEROBIC 5CC  Final   Culture NO GROWTH 5 DAYS  Final   Report Status 01/07/2016 FINAL  Final    Coagulation Studies: No results for input(s): LABPROT, INR in the last 72 hours.  Urinalysis: No results for input(s): COLORURINE, LABSPEC, PHURINE, GLUCOSEU, HGBUR, BILIRUBINUR, KETONESUR, PROTEINUR, UROBILINOGEN, NITRITE, LEUKOCYTESUR in the last 168 hours.  Invalid input(s): APPERANCEUR  Lipid Panel:  No results found for: CHOL, TRIG, HDL, CHOLHDL, VLDL, LDLCALC  HgbA1C: No results found for: HGBA1C  Urine Drug Screen:  No results found for: LABOPIA, COCAINSCRNUR, LABBENZ, AMPHETMU, THCU, LABBARB  Alcohol Level: No results for input(s): ETH in the last 168 hours.   Imaging: Ct Head Wo Contrast  Result Date:  04/24/2016 CLINICAL DATA:  Headache, unresponsive EXAM: CT HEAD WITHOUT CONTRAST TECHNIQUE: Contiguous axial images were obtained from the base of the skull through the vertex without intravenous contrast. COMPARISON:  02/13/2016 FINDINGS: Brain: No intracranial hemorrhage, mass effect or midline shift. No acute cortical infarction. No mass lesion is noted on this unenhanced scan. Stable atrophy and chronic white matter disease. Vascular: Atherosclerotic calcifications of carotid siphon Skull: No skull fracture. Sinuses/Orbits: Paranasal sinuses and mastoid air cells are unremarkable. Other: None IMPRESSION: No acute intracranial abnormality. No definite acute cortical infarction. Stable atrophy and chronic white matter disease. Electronically Signed   By: Natasha MeadLiviu  Pop M.D.   On: 04/24/2016 09:52     Assessment/Plan: 81 year old female found unresponsive and with new onset seizures.  Etiology unclear at this time.  Head CT reviewed and shows no acute changes including hemorrhage.  BP elevated.  Patient now sedated and unresponsive.  Can not rule out the possibility of subclinical seizure activity.  Will need to rule out the possibility of stroke and PRES as well.  Further work up recommended..  Recommendations: 1.  EEG stat 2.  MRI of the brain without contrast 3.  Keppra 1000mg  IV now with 1000mg  IV q12 hours maintenance  4.  Seizure precautions 5.  Ativan prn continued seizure activity 6.  Serum magnesium and phosphorus  This patient is critically ill and at significant risk of neurological worsening, death and care requires constant monitoring of vital signs, hemodynamics,respiratory and cardiac monitoring, neurological assessment, discussion with family, other specialists and medical decision making of high complexity. I spent 40 minutes of neurocritical care time  in the care of  this patient.   Thana Farr, MD Neurology [phone] 04/24/2016, 10:14 AM

## 2016-04-24 NOTE — ED Notes (Signed)
Transporting patient to room 124-1C 

## 2016-04-24 NOTE — ED Triage Notes (Addendum)
Pt via ems from home unresponsive after seizing. Pt had headache in temple areas last night around 2230; pt unable to wake up this morning. Had 1 seizure witnessed by fire dept; 3 witnessed by ems. EMS gave 2.5 mg IV versed. Pt not responsive to voice or pain. Pt on NRB at 15L upon arrival; O2 sats 97-100%

## 2016-04-24 NOTE — Progress Notes (Signed)
Patient just arrived to unit. Keppra 1000mg  and Levaquin 500mg  due at 1200pm not given in ED. Keppra just given at 1544. Waiting for Levaquin to arrive from Pharmacy.

## 2016-04-24 NOTE — ED Provider Notes (Signed)
[facility] Emergency Department Provider Note  ____________________________________________  Time seen: Approximately 10:25 AM  I have reviewed the triage vital signs and the nursing notes.   HISTORY  Chief Complaint Seizures  Level 5 caveat:  Portions of the history and physical were unable to be obtained due to AMS   HPI Julia Huffman is a 81 y.o. female h/o CKD, vasculitis, HTN, pulmonary fibrosis who presents via EMS for altered mental status and seizure. According to EMS patient went to bed yesterday at 9:30 complaining of a headache. This morning family was unable to wake her up and called the ambulance. Patient had 4 seizures on route to the emergency room. During the last seizure patient received 2.5 mg of versed said. Patient arrives postictal, breathing and satting 100% on a nonrebreather, hypertensive to the 190s, blood glucose of 98, pinpoint pupils, no movement of any extremities.  Past Medical History:  Diagnosis Date  . Arthritis   . Chicken pox   . Chronic kidney disease   . History of vasculitis    History of Microscopic polyangitis  . Hypertension   . Osteoporosis   . Pulmonary fibrosis [facility](HCC)     Patient Active Problem List   Diagnosis Date Noted  . Seizure (HCC) 04/24/2016  . Headache around the eyes 04/23/2016  . Confusion 04/23/2016  . CKD (chronic kidney disease) stage 3, GFR 30-59 ml/min 01/23/2016  . Hoarseness 01/23/2016  . Dysphagia 01/23/2016  . Osteoporosis 01/22/2016  . Postinflammatory pulmonary fibrosis (HCC) 01/15/2016  . Chronic respiratory failure (HCC) 01/15/2016    Past Surgical History:  Procedure Laterality Date  . BREAST SURGERY    . TOTAL HIP ARTHROPLASTY      Prior to Admission medications   Medication Sig Start Date End Date Taking? Authorizing Provider  acetaminophen (TYLENOL) 500 MG tablet Take 500 mg by mouth every 6 (six) hours as needed.    Historical Provider, MD  aspirin EC 81 MG tablet  Take 81 mg by mouth daily.    Historical Provider, MD    Allergies Review of patient's allergies indicates no known allergies.  Family History  Problem Relation Age of Onset  . Heart attack Mother   . Arthritis Mother   . Heart disease Mother   . Hypertension Mother     Social History Social History  Substance Use Topics  . Smoking status: Former Smoker    Packs/day: 0.25    Years: 5.00    Types: Cigarettes    Quit date: 01/22/1991  . Smokeless tobacco: Never Used  . Alcohol use No    Review of Systems Unable to obtain ____________________________________________   PHYSICAL EXAM:  VITAL SIGNS: ED Triage Vitals [04/24/16 1010]  Enc Vitals Group     BP (!) 172/100     Pulse Rate (!) 112     Resp (!) 28     Temp 98.4 F (36.9 C)     Temp Source Axillary     SpO2 100 %     Weight      Height      Head Circumference      Peak Flow      Pain Score      Pain Loc      Pain Edu?      Excl. in GC?     Constitutional: Post-ictal, breathing, not answering to questions.  HEENT:      Head: Normocephalic and atraumatic.         Eyes: Conjunctivae  are normal. Sclera is non-icteric. EOMI. PERRL      Mouth/Throat: Mucous membranes are dry.       Neck: Supple with no signs of meningismus. Cardiovascular: Tachycardic with regular rhythm. No murmurs, gallops, or rubs. 2+ symmetrical distal pulses are present in all extremities. No JVD. Respiratory: Normal respiratory effort. Lungs are clear to auscultation bilaterally. No wheezes, crackles, or rhonchi.  Gastrointestinal: Soft, non distended with positive bowel sounds. No rebound or guarding. Musculoskeletal: No edema, cyanosis, or erythema of extremities. Neurologic: Face is symmetric. Moving all extremities. Skin: Skin is warm, dry and intact. No rash noted.  ____________________________________________   LABS (all labs ordered are listed, but only abnormal results are displayed)  Labs Reviewed  BLOOD GAS, VENOUS -  Abnormal; Notable for the following:       Result Value   pH, Ven 7.16 (*)    pO2, Ven 48.0 (*)    Acid-base deficit 8.1 (*)    All other components within normal limits  CBC WITH DIFFERENTIAL/PLATELET - Abnormal; Notable for the following:    Hemoglobin 11.9 (*)    RDW 15.4 (*)    Neutro Abs 8.1 (*)    All other components within normal limits  COMPREHENSIVE METABOLIC PANEL - Abnormal; Notable for the following:    Potassium 3.2 (*)    CO2 21 (*)    Glucose, Bld 165 (*)    Calcium 8.8 (*)    Albumin 3.4 (*)    AST 70 (*)    All other components within normal limits  TROPONIN I - Abnormal; Notable for the following:    Troponin I 0.06 (*)    All other components within normal limits  URINALYSIS COMPLETEWITH MICROSCOPIC ([facility] ONLY) - Abnormal; Notable for the following:    Color, Urine COLORLESS (*)    APPearance CLEAR (*)    Glucose, UA 150 (*)    Protein, ur 30 (*)    All other components within normal limits  URINE DRUG SCREEN, QUALITATIVE ([facility] ONLY) - Abnormal; Notable for the following:    Benzodiazepine, Ur Scrn POSITIVE (*)    All other components within normal limits  BLOOD GAS, ARTERIAL - Abnormal; Notable for the following:    pO2, Arterial 52 (*)    Bicarbonate 28.5 (*)    Acid-Base Excess 3.2 (*)    All other components within normal limits  URINE CULTURE  GLUCOSE, CAPILLARY  HEMOGLOBIN A1C   ____________________________________________  EKG  ED ECG REPORT I, Nita Sickle, the attending physician, personally viewed and interpreted this ECG.  Normal sinus rhythm, rate of 81, normal intervals, normal axis, no ST elevations or depressions. ____________________________________________  RADIOLOGY  Head CT: No acute intracranial abnormality. No definite acute cortical infarction. Stable atrophy and chronic white matter disease. ____________________________________________   PROCEDURES  Procedure(s) performed: None Procedures Critical Care  performed: yes  CRITICAL CARE Performed by: Nita Sickle  ?  Total critical care time: 40 min  Critical care time was exclusive of separately billable procedures and treating other patients.  Critical care was necessary to treat or prevent imminent or life-threatening deterioration.  Critical care was time spent personally by me on the following activities: development of treatment plan with patient and/or surrogate as well as nursing, discussions with consultants, evaluation of patient's response to treatment, examination of patient, obtaining history from patient or surrogate, ordering and performing treatments and interventions, ordering and review of laboratory studies, ordering and review of radiographic studies, pulse oximetry and re-evaluation of patient's condition.  ____________________________________________   INITIAL IMPRESSION / ASSESSMENT AND PLAN / ED COURSE  81 y.o. female h/o CKD, vasculitis, HTN, pulmonary fibrosis who presents via EMS for altered mental status and seizure. Unable to obtain history as patient is post-ictal. Patient protecting airway, no vomiting, breathing well, satting 100%. VBG showing good ventilation, Low pH from lactic acid from seizure. Patient was taken to CT scan STAT with no evidence of bleeding. Neurology consulted and Dr. Thad Ranger evaluated patient in the ED soon after patient's arrival. She ordered MRI and EEG. Will monitor patient closely for need to intervention if patient not protecting airway but I do believe her current neuro exam is due to post-ictal state and sedation from benzos. Labs pending.   Clinical Course  Comment By Time  Patient starting to move all extremities, grimaces with sternal rib. Continues to breath well and maintaining her airway. Nita Sickle, MD 10/27 1052    Pertinent labs & imaging results that were available during my care of the patient were reviewed by me and considered in my medical decision making  (see chart for details).    ____________________________________________   FINAL CLINICAL IMPRESSION(S) / ED DIAGNOSES  Final diagnoses:  Seizure (HCC)      NEW MEDICATIONS STARTED DURING THIS VISIT:  New Prescriptions   No medications on file     Note:  This document was prepared using Dragon voice recognition software and may include unintentional dictation errors.    Nita Sickle, MD 04/24/16 [phone]

## 2016-04-25 ENCOUNTER — Inpatient Hospital Stay: Payer: Medicare Other

## 2016-04-25 DIAGNOSIS — R569 Unspecified convulsions: Principal | ICD-10-CM

## 2016-04-25 LAB — BASIC METABOLIC PANEL
ANION GAP: 6 (ref 5–15)
BUN: 12 mg/dL (ref 6–20)
CALCIUM: 8.7 mg/dL — AB (ref 8.9–10.3)
CO2: 24 mmol/L (ref 22–32)
Chloride: 107 mmol/L (ref 101–111)
Creatinine, Ser: 0.92 mg/dL (ref 0.44–1.00)
GFR calc Af Amer: >60 mL/min (ref >60)
GFR, EST NON AFRICAN AMERICAN: 58 mL/min — AB (ref >60)
GLUCOSE: 81 mg/dL (ref 65–99)
Potassium: 4.2 mmol/L (ref 3.5–5.1)
SODIUM: 137 mmol/L (ref 135–145)

## 2016-04-25 LAB — CBC
HCT: 39.4 % (ref 35.0–47.0)
Hemoglobin: 12.8 g/dL (ref 12.0–16.0)
MCH: 27.2 pg (ref 26.0–34.0)
MCHC: 32.6 g/dL (ref 32.0–36.0)
MCV: 83.3 fL (ref 80.0–100.0)
PLATELETS: 265 10*3/uL (ref 150–440)
RBC: 4.73 MIL/uL (ref 3.80–5.20)
RDW: 14.8 % — AB (ref 11.5–14.5)
WBC: 8.6 10*3/uL (ref 3.6–11.0)

## 2016-04-25 LAB — HEMOGLOBIN
Hemoglobin: 12.9 g/dL (ref 12.0–16.0)
Hemoglobin: 13.1 g/dL (ref 12.0–16.0)

## 2016-04-25 LAB — HEMOGLOBIN A1C
HEMOGLOBIN A1C: 5.3 % (ref 4.8–5.6)
MEAN PLASMA GLUCOSE: 105 mg/dL

## 2016-04-25 LAB — OCCULT BLOOD X 1 CARD TO LAB, STOOL: Fecal Occult Bld: POSITIVE — AB

## 2016-04-25 LAB — URINE CULTURE: CULTURE: NO GROWTH

## 2016-04-25 MED ORDER — ZIPRASIDONE MESYLATE 20 MG IM SOLR
10.0000 mg | Freq: Once | INTRAMUSCULAR | Status: AC
  Administered 2016-04-25: 10 mg via INTRAMUSCULAR
  Filled 2016-04-25: qty 20

## 2016-04-25 MED ORDER — SODIUM CHLORIDE 0.9 % IV SOLN
500.0000 mg | Freq: Two times a day (BID) | INTRAVENOUS | Status: DC
  Administered 2016-04-26 – 2016-04-27 (×3): 500 mg via INTRAVENOUS
  Filled 2016-04-25 (×4): qty 5

## 2016-04-25 MED ORDER — TECHNETIUM TC 99M-LABELED RED BLOOD CELLS IV KIT
24.1000 | PACK | Freq: Once | INTRAVENOUS | Status: AC | PRN
  Administered 2016-04-25: 24.1 via INTRAVENOUS

## 2016-04-25 NOTE — Progress Notes (Signed)
Pt agitated and pulling telemetry leads off.  On call MD paged, Dr. Renae GlossWieting to put in new orders.  Orson Apeanielle Odesser Tourangeau, RN

## 2016-04-25 NOTE — Evaluation (Signed)
Physical Therapy Evaluation Patient Details Name: Julia Huffman MRN: [id number] DOB: May 14, 1935 Today's Date: 04/25/2016   History of Present Illness  Patient is a 81 y/o female that presents after episode of decreased responsiveness and seizure like activity. MRI negative for acute pathology, EEG indicative of recent seizure.   Clinical Impression  Patient is a 81 y/o female being followed after apparent seizure like activity. Prior to this admission she has been able to get up and to the restroom independently with rollator walker. Today however, she presents with confusion and generalized weakness/lethargy. She is even noted to let saliva form a pool on gown, unclear if this is from seizure or medications. Her daughter reports her cognitive status is much different than baseline in this session, which appears to the primary limiting factor for her ability to participate in more prolonged activity. Would recommend SNF placement currently as patient is requiring a higher level of care for mobility than family can provide.     Follow Up Recommendations SNF    Equipment Recommendations       Recommendations for Other Services       Precautions / Restrictions Precautions Precautions: Fall Restrictions Weight Bearing Restrictions: No      Mobility  Bed Mobility Overal bed mobility: Needs Assistance Bed Mobility: Supine to Sit;Sit to Supine     Supine to sit: Min assist Sit to supine: Mod assist   General bed mobility comments: Patient required assistance to manage torso for transfers, secondary to weakness/confusion.   Transfers Overall transfer level: Needs assistance Equipment used: 1 person hand held assist Transfers: Sit to/from Stand Sit to Stand: Mod assist;Max assist         General transfer comment: Patient able to provide assistance by pulling through PT's arm, otherwise significant loss of strength noted in her LEs as she required significant assistance to  elevate off surface.   Ambulation/Gait Ambulation/Gait assistance: Mod assist;Max assist Ambulation Distance (Feet): 3 Feet Assistive device: 1 person hand held assist Gait Pattern/deviations: Decreased step length - right;Decreased step length - left;Shuffle;Step-to pattern;Trunk flexed   Gait velocity interpretation: <1.8 ft/sec, indicative of risk for recurrent falls General Gait Details: Very minimal step lengths performed, unclear if due to weakness or confusion (or combination of the 2).   Stairs            Wheelchair Mobility    Modified Rankin (Stroke Patients Only)       Balance Overall balance assessment: Needs assistance Sitting-balance support: No upper extremity supported Sitting balance-Leahy Scale: Fair   Postural control: Posterior lean Standing balance support: Bilateral upper extremity supported Standing balance-Leahy Scale: Poor                               Pertinent Vitals/Pain Pain Assessment: Faces Faces Pain Scale: Hurts a little bit Pain Location: Not able to respond to any questions regarding pain.  Pain Intervention(s): Limited activity within patient's tolerance;Monitored during session    Home Living Family/patient expects to be discharged to:: Private residence Living Arrangements: Children Available Help at Discharge: Family (daughter) Type of Home: House Home Access: Level entry     Home Layout: Two level;Able to live on main level with bedroom/bathroom Home Equipment: Walker - 4 wheels;Bedside commode;Grab bars - toilet Additional Comments: Per previous encounter, patient had a fall in the shower in the past year and now only takes sink baths.     Prior Function Level of Independence:  Independent with assistive device(s)         Comments: per old notes she is able to use a rollator walker for short distances 10-15 feet with rest breaks between bouts of this distance.      Hand Dominance         Extremity/Trunk Assessment   Upper Extremity Assessment: Generalized weakness (No focal weakness identified. )           Lower Extremity Assessment: Generalized weakness (No focal weakness identified. )         Communication   Communication:  (Very soft spoken/mumbles responses today in this session)  Cognition Arousal/Alertness: Lethargic Behavior During Therapy: Flat affect Overall Cognitive Status: Impaired/Different from baseline Area of Impairment: Orientation;Following commands Orientation Level: Disoriented to;Place;Time;Situation     Following Commands: Follows one step commands inconsistently            General Comments      Exercises Other Exercises Other Exercises: Assisted patient to bedside commode, with standing from commode, and return to bed after toileting.    Assessment/Plan    PT Assessment Patient needs continued PT services  PT Problem List            PT Treatment Interventions DME instruction;Therapeutic activities;Therapeutic exercise;Gait training;Patient/family education;Balance training    PT Goals (Current goals can be found in the Care Plan section)  Acute Rehab PT Goals Patient Stated Goal: To increase her strength and congnition  PT Goal Formulation: With family Time For Goal Achievement: 05/09/16 Potential to Achieve Goals: Fair    Frequency Min 2X/week   Barriers to discharge        Co-evaluation               End of Session Equipment Utilized During Treatment: Gait belt Activity Tolerance: Patient limited by fatigue;Patient limited by lethargy (Limited by confusion as well. \) Patient left: in bed;with bed alarm set;with call bell/phone within reach;with family/visitor present;with nursing/sitter in room Nurse Communication: Mobility status (Red noted in stool)         Time: [id number] PT Time Calculation (min) (ACUTE ONLY): 14 min   Charges:   PT Evaluation $PT Eval Moderate Complexity: 1 Procedure      PT G Codes:       Kerin RansomPatrick A McNamara, PT, DPT    04/25/2016, 3:29 PM

## 2016-04-25 NOTE — Consult Note (Signed)
Reason for Consult:Unresponsive  Referring Physician: Don PerkingVeronese  Subjective: Much improved. Close to baseline as per family.  EEG done.  S/p discussion with family    Past Medical History:  Diagnosis Date  . Arthritis   . Chicken pox   . Chronic kidney disease   . History of vasculitis    History of Microscopic polyangitis  . Hypertension   . Osteoporosis   . Pulmonary fibrosis (HCC)     Past Surgical History:  Procedure Laterality Date  . BREAST SURGERY    . TOTAL HIP ARTHROPLASTY      Family History  Problem Relation Age of Onset  . Heart attack Mother   . Arthritis Mother   . Heart disease Mother   . Hypertension Mother     Social History:  reports that she quit smoking about 25 years ago. Her smoking use included Cigarettes. She has a 1.25 pack-year smoking history. She has never used smokeless tobacco. She reports that she does not drink alcohol or use drugs.  No Known Allergies  Medications:  I have reviewed the patient's current medications. Prior to Admission:  Prior to Admission medications   Medication Sig Start Date End Date Taking? Authorizing Provider  acetaminophen (TYLENOL) 500 MG tablet Take 500 mg by mouth every 6 (six) hours as needed.    Historical Provider, MD  aspirin EC 81 MG tablet Take 81 mg by mouth daily.    Historical Provider, MD    Scheduled: . enoxaparin (LOVENOX) injection  30 mg Subcutaneous Q24H  . [START ON 04/26/2016] levETIRAcetam  500 mg Intravenous Q12H  . sodium chloride flush  3 mL Intravenous Q12H    ROS: Unable to be obtained  Physical Examination: Blood pressure (!) 151/79, pulse 87, temperature 97.9 F (36.6 C), temperature source Oral, resp. rate 18, height 5\' 5"  (1.651 m), weight 43.7 kg (96 lb 6.4 oz), SpO2 100 %.  HEENT-  Normocephalic, no lesions, without obvious abnormality.  Normal external eye and conjunctiva.  Normal TM's bilaterally.  Normal auditory canals and external ears. Normal external nose, mucus  membranes and septum.  Normal pharynx. Cardiovascular- S1, S2 normal, pulses palpable throughout   Lungs- chest clear, no wheezing, rales, normal symmetric air entry Abdomen- soft, non-tender; bowel sounds normal; no masses,  no organomegaly Extremities- no edema Lymph-no adenopathy palpable Musculoskeletal-no joint tenderness, deformity or swelling Skin-warm and dry, no hyperpigmentation, vitiligo, or suspicious lesions  Neurological Examination Mental Status: Patient does not respond to verbal stimuli.  Does not respond to deep sternal rub.  Does not follow commands.  No verbalizations noted.  Cranial Nerves: II: patient does not respond confrontation bilaterally, pupils right 3 mm, left 3 mm,and reactive bilaterally III,IV,VI: doll's response absent bilaterally.  V,VII: corneal reflex reduced bilaterally  VIII: patient does not respond to verbal stimuli IX,X: gag reflex reduced, XI: trapezius strength unable to test bilaterally XII: tongue strength unable to test Motor: Extremities flaccid throughout.  No spontaneous movement noted.  No purposeful movements noted. Sensory: Does not respond to noxious stimuli in any extremity. Deep Tendon Reflexes:  2+ throughout with sustained clonus in the RLE and 4-5 beats of clonus in the LLE Plantars: downgoing bilaterally Cerebellar: Unable to perform   Laboratory Studies:   Basic Metabolic Panel:  Recent Labs Lab 04/24/16 0956 04/25/16 0624  NA 139 137  K 3.2* 4.2  CL 103 107  CO2 21* 24  GLUCOSE 165* 81  BUN 14 12  CREATININE 0.86 0.92  CALCIUM 8.8* 8.7*  Liver Function Tests:  Recent Labs Lab 04/24/16 0956  AST 70*  ALT 30  ALKPHOS 69  BILITOT 0.6  PROT 7.0  ALBUMIN 3.4*   No results for input(s): LIPASE, AMYLASE in the last 168 hours. No results for input(s): AMMONIA in the last 168 hours.  CBC:  Recent Labs Lab 04/24/16 0956 04/25/16 0624 04/25/16 1220  WBC 9.9 8.6  --   NEUTROABS 8.1*  --   --    HGB 11.9* 12.8 12.9  HCT 35.7 39.4  --   MCV 84.8 83.3  --   PLT 258 265  --     Cardiac Enzymes:  Recent Labs Lab 04/24/16 0956  TROPONINI 0.06*    BNP: Invalid input(s): POCBNP  CBG:  Recent Labs Lab 04/24/16 0928  GLUCAP 92    Microbiology: Results for orders placed or performed during the hospital encounter of 04/24/16  Urine culture     Status: None   Collection Time: 04/24/16  9:56 AM  Result Value Ref Range Status   Specimen Description URINE, RANDOM  Final   Special Requests NONE  Final   Culture NO GROWTH [facility]   Final   Report Status 04/25/2016 FINAL  Final    Coagulation Studies: No results for input(s): LABPROT, INR in the last 72 hours.  Urinalysis:   Recent Labs Lab 04/24/16 0956  COLORURINE COLORLESS*  LABSPEC 1.005  PHURINE 7.0  GLUCOSEU 150*  HGBUR NEGATIVE  BILIRUBINUR NEGATIVE  KETONESUR NEGATIVE  PROTEINUR 30*  NITRITE NEGATIVE  LEUKOCYTESUR NEGATIVE    Lipid Panel:  No results found for: CHOL, TRIG, HDL, CHOLHDL, VLDL, LDLCALC  HgbA1C:  Lab Results  Component Value Date   HGBA1C 5.3 04/24/2016    Urine Drug Screen:      Component Value Date/Time   LABOPIA NONE DETECTED 04/24/2016 0956   COCAINSCRNUR NONE DETECTED 04/24/2016 0956   LABBENZ POSITIVE (A) 04/24/2016 0956   AMPHETMU NONE DETECTED 04/24/2016 0956   THCU NONE DETECTED 04/24/2016 0956   LABBARB NONE DETECTED 04/24/2016 0956    Alcohol Level: No results for input(s): ETH in the last 168 hours.   Imaging: Dg Chest 1 View  Result Date: 04/25/2016 CLINICAL DATA:  Pneumonia EXAM: CHEST 1 VIEW COMPARISON:  01/02/2016 FINDINGS: Coarse interstitial markings throughout both lungs are unchanged compatible with pulmonary fibrosis which is advanced. No superimposed pneumonia. Negative for heart failure or effusion. Apical scarring bilaterally. IMPRESSION: Diffuse pulmonary fibrosis stable.  No superimposed pneumonia. Electronically  Signed   By: Marlan Palauharles  Clark M.D.   On: 04/25/2016 09:40   Dg Abd 1 View  Result Date: 04/25/2016 CLINICAL DATA:  Abdominal pain. EXAM: ABDOMEN - 1 VIEW COMPARISON:  None. FINDINGS: The bowel gas pattern is normal. No free air or free fluid. Minimal air in the bowel. Extensive arterial vascular calcifications in the abdomen. Severe degenerative disc and joint disease at L3-4 and to a lesser degree in the remainder of the lumbar spine. Probable fusion of the lower lumbar spine. Chronic interstitial disease at the lung bases consistent with pulmonary fibrosis with honeycombing. Right total hip prosthesis in place. Multiple surgical clips in the pelvis. IMPRESSION: Benign-appearing abdomen. Aortic atherosclerosis. Pulmonary fibrosis. Multilevel degenerative facet arthritis in the lumbar spine. Electronically Signed   By: Francene BoyersJames  Maxwell M.D.   On: 04/25/2016 14:59   Ct Head Wo Contrast  Result Date: 04/24/2016 CLINICAL DATA:  Headache, unresponsive EXAM: CT HEAD WITHOUT CONTRAST TECHNIQUE: Contiguous axial images were obtained from the base  of the skull through the vertex without intravenous contrast. COMPARISON:  02/13/2016 FINDINGS: Brain: No intracranial hemorrhage, mass effect or midline shift. No acute cortical infarction. No mass lesion is noted on this unenhanced scan. Stable atrophy and chronic white matter disease. Vascular: Atherosclerotic calcifications of carotid siphon Skull: No skull fracture. Sinuses/Orbits: Paranasal sinuses and mastoid air cells are unremarkable. Other: None IMPRESSION: No acute intracranial abnormality. No definite acute cortical infarction. Stable atrophy and chronic white matter disease. Electronically Signed   By: Natasha Mead M.D.   On: 04/24/2016 09:52   Mr Brain Wo Contrast  Result Date: 04/24/2016 CLINICAL DATA:  Unresponsive.  Seizure. EXAM: MRI HEAD WITHOUT CONTRAST TECHNIQUE: Multiplanar, multiecho pulse sequences of the brain and surrounding structures were  obtained without intravenous contrast. COMPARISON:  Head CT from earlier today FINDINGS: Brain: No acute infarction, hemorrhage, hydrocephalus, extra-axial collection or mass lesion. There is chronic cerebral microvascular ischemia that is moderate for age. Age congruent cerebral volume loss. No generalized chronic blood products. 2 foci of susceptibility along the inferior right frontal lobe could be volume averaging of the skullbase. No cortical finding to explain seizure. Vascular: Normal flow voids. Skull and upper cervical spine: Cervical facet arthropathy. No focal marrow lesion. Sinuses/Orbits: Bilateral cataract resection.  No acute finding IMPRESSION: 1. No acute finding.  No cortical finding to explain seizure. 2. Moderate chronic microvascular disease. Electronically Signed   By: Marnee Spring M.D.   On: 04/24/2016 13:53     Assessment/Plan: 81 year old female found unresponsive and with new onset seizures.  Etiology unclear at this time.  Head CT reviewed and shows no acute changes including hemorrhage.  BP elevated.  Pt is currently following commands and close to baseline  - unclear etiology of seizures as imaging MRI/CTH does not show acute abnormalities, no clear infection - EEG generalized slowing and no seizure activity - Keppra changed to 500 q12 - PT/OT - possibly d/c planning tomorrow and follow up Dr. Sherryll Burger or Dr. Malvin Johns as out pt.    04/25/2016, 3:10 PM

## 2016-04-25 NOTE — Progress Notes (Signed)
Pt in Nuc med, this nurse called by that department and told that pt had removed her own IV.  Fluids stopped and will attempt to replace when pt returns to the floor.  Orson Apeanielle Xai Frerking, RN

## 2016-04-25 NOTE — Progress Notes (Signed)
Pt had small BM.  BM was formed with blood in it.  When wiping pt also had blood present on toilet paper.  Dr. Juliene PinaMody made aware.  Pt's hgb is 12.8.

## 2016-04-25 NOTE — Progress Notes (Signed)
[facility] - [place] at [facility]   PATIENT NAME: Julia Huffman    MR#:  [id number]  DATE OF BIRTH:  August 16, 1934  SUBJECTIVE:    Patient more alert this morning. No seizures reported overnight.  REVIEW OF SYSTEMS:    Review of Systems  Constitutional: Negative.  Negative for chills, fever and malaise/fatigue.  HENT: Negative.  Negative for ear discharge, ear pain, hearing loss, nosebleeds and sore throat.   Eyes: Negative.  Negative for blurred vision and pain.  Respiratory: Negative.  Negative for cough, hemoptysis, shortness of breath and wheezing.   Cardiovascular: Negative.  Negative for chest pain, palpitations and leg swelling.  Gastrointestinal: Negative.  Negative for abdominal pain, blood in stool, diarrhea, nausea and vomiting.  Genitourinary: Negative.  Negative for dysuria.  Musculoskeletal: Negative.  Negative for back pain.  Skin: Negative.   Neurological: Positive for focal weakness and seizures. Negative for dizziness, tremors, speech change and headaches.  Endo/Heme/Allergies: Negative.  Does not bruise/bleed easily.  Psychiatric/Behavioral: Negative.  Negative for depression, hallucinations and suicidal ideas.    Tolerating Diet: npo      DRUG ALLERGIES:  No Known Allergies  VITALS:  Blood pressure (!) 165/83, pulse 94, temperature 97.5 F (36.4 C), resp. rate 16, height 5\' 5"  (1.651 m), weight 43.7 kg (96 lb 6.4 oz), SpO2 98 %.  PHYSICAL EXAMINATION:   Physical Exam    LABORATORY PANEL:   CBC  Recent Labs Lab 04/25/16 0624  WBC 8.6  HGB 12.8  HCT 39.4  PLT 265   ------------------------------------------------------------------------------------------------------------------  Chemistries   Recent Labs Lab 04/24/16 0956 04/25/16 0624  NA 139 137  K 3.2* 4.2  CL 103 107  CO2 21* 24  GLUCOSE 165* 81  BUN 14 12  CREATININE 0.86 0.92  CALCIUM 8.8* 8.7*  AST 70*  --   ALT 30  --   ALKPHOS 69  --   BILITOT  0.6  --    ------------------------------------------------------------------------------------------------------------------  Cardiac Enzymes  Recent Labs Lab 04/24/16 0956  TROPONINI 0.06*   ------------------------------------------------------------------------------------------------------------------  RADIOLOGY:  Dg Chest 1 View  Result Date: 04/25/2016 CLINICAL DATA:  Pneumonia EXAM: CHEST 1 VIEW COMPARISON:  01/02/2016 FINDINGS: Coarse interstitial markings throughout both lungs are unchanged compatible with pulmonary fibrosis which is advanced. No superimposed pneumonia. Negative for heart failure or effusion. Apical scarring bilaterally. IMPRESSION: Diffuse pulmonary fibrosis stable.  No superimposed pneumonia. Electronically Signed   By: Marlan Palauharles  Clark M.D.   On: 04/25/2016 09:40   Ct Head Wo Contrast  Result Date: 04/24/2016 CLINICAL DATA:  Headache, unresponsive EXAM: CT HEAD WITHOUT CONTRAST TECHNIQUE: Contiguous axial images were obtained from the base of the skull through the vertex without intravenous contrast. COMPARISON:  02/13/2016 FINDINGS: Brain: No intracranial hemorrhage, mass effect or midline shift. No acute cortical infarction. No mass lesion is noted on this unenhanced scan. Stable atrophy and chronic white matter disease. Vascular: Atherosclerotic calcifications of carotid siphon Skull: No skull fracture. Sinuses/Orbits: Paranasal sinuses and mastoid air cells are unremarkable. Other: None IMPRESSION: No acute intracranial abnormality. No definite acute cortical infarction. Stable atrophy and chronic white matter disease. Electronically Signed   By: Natasha MeadLiviu  Pop M.D.   On: 04/24/2016 09:52   Mr Brain Wo Contrast  Result Date: 04/24/2016 CLINICAL DATA:  Unresponsive.  Seizure. EXAM: MRI HEAD WITHOUT CONTRAST TECHNIQUE: Multiplanar, multiecho pulse sequences of the brain and surrounding structures were obtained without intravenous contrast. COMPARISON:  Head CT  from earlier today FINDINGS: Brain: No acute infarction, hemorrhage,  hydrocephalus, extra-axial collection or mass lesion. There is chronic cerebral microvascular ischemia that is moderate for age. Age congruent cerebral volume loss. No generalized chronic blood products. 2 foci of susceptibility along the inferior right frontal lobe could be volume averaging of the skullbase. No cortical finding to explain seizure. Vascular: Normal flow voids. Skull and upper cervical spine: Cervical facet arthropathy. No focal marrow lesion. Sinuses/Orbits: Bilateral cataract resection.  No acute finding IMPRESSION: 1. No acute finding.  No cortical finding to explain seizure. 2. Moderate chronic microvascular disease. Electronically Signed   By: Marnee SpringJonathon  Watts M.D.   On: 04/24/2016 13:53     ASSESSMENT AND PLAN:    81 year old female with a history of pulmonary fibrosis who presents with new onset seizure.   1. Seizure: EEG pending. MRI brain negative. Appreciate neurology consult. Continue Keppra.  2. History of pulmonary fibrosis with intermittent oxygen use: Continue oxygen. Chest x-ray showing diffuse pulmonary fibrosis without superimposed pneumonia. Discontinue Levaquin.  Management plans discussed with the patient and she is in agreement.  CODE STATUS: full  TOTAL TIME TAKING CARE OF THIS PATIENT: 30 minutes.     POSSIBLE D/C 2 days, DEPENDING ON CLINICAL CONDITION.   Evelyn Moch M.D on 04/25/2016 at 11:06 AM  Between 7am to 6pm - Pager - [phone] After 6pm go to [email or web address] - password [email or web address]  [facility]  Office  [phone]  CC: Primary care physician; Tommie SamsJayce G Cook, DO  Note: This dictation was prepared with Dragon dictation along with smaller phrase technology. Any transcriptional errors that result from this process are unintentional.

## 2016-04-26 DIAGNOSIS — K2971 Gastritis, unspecified, with bleeding: Secondary | ICD-10-CM

## 2016-04-26 LAB — HEMOGLOBIN
HEMOGLOBIN: 13.9 g/dL (ref 12.0–16.0)
HEMOGLOBIN: 13.7 g/dL (ref 12.0–16.0)
Hemoglobin: 14.7 g/dL (ref 12.0–16.0)
Hemoglobin: 13.7 g/dL (ref 12.0–16.0)

## 2016-04-26 MED ORDER — AMLODIPINE BESYLATE 10 MG PO TABS
10.0000 mg | ORAL_TABLET | Freq: Every day | ORAL | Status: DC
  Administered 2016-04-26 – 2016-04-27 (×2): 10 mg via ORAL
  Filled 2016-04-26 (×2): qty 1

## 2016-04-26 MED ORDER — LEVETIRACETAM 500 MG PO TABS: 500.0000 mg | ORAL_TABLET | Freq: Two times a day (BID) | ORAL | 0 refills | Status: DC

## 2016-04-26 NOTE — Discharge Summary (Addendum)
[facility] - [place] at [facility]   PATIENT NAME: Julia Huffman    MR#:  [id number]  DATE OF BIRTH:  06-25-1935  DATE OF ADMISSION:  04/24/2016 ADMITTING PHYSICIAN: Auburn BilberryShreyang Patel, MD  DATE OF DISCHARGE: 04/27/2016  PRIMARY CARE PHYSICIAN: Tommie SamsJayce G Cook, DO    ADMISSION DIAGNOSIS:  Seizure (HCC) [R56.9] Unresponsive [R41.89]  DISCHARGE DIAGNOSIS:  Active Problems:   Seizure (HCC)   Unresponsive   Gastrointestinal hemorrhage associated with gastritis   SECONDARY DIAGNOSIS:   Past Medical History:  Diagnosis Date  . Arthritis   . Chicken pox   . Chronic kidney disease   . History of vasculitis    History of Microscopic polyangitis  . Hypertension   . Osteoporosis   . Pulmonary fibrosis [facility](HCC)     HOSPITAL COURSE:    81 year old female found unresponsive and with new onset seizures.    1. Seziure of unclear etiology of seizures as imaging MRI/CTH does not show acute abnormalities. She has no source of infection EEG shows generalized slowing and no seizure activity As Per neurology recommendations she should continue Keppra 500 q12. Her primary care physician has already placed an outpatient neurology consultation due to signs of cognitive impairment/dementia.  2. Rectal bleeding: Hemoglobin is remained very stable. KUB and GI bleeding scan showed no acute abnormalities. Likely this was hemorrhoidal in nature. Gi did not advise further workup.  3. History of pulmonary fibrosis: Chest x-ray did not show evidence of pneumonia and therefore she was not treated for pneumonia.   DISCHARGE CONDITIONS AND DIET:   Stable Soft diet  CONSULTS OBTAINED:  Treatment Team:  Kym GroomNeuro1 [facility], MD  DRUG ALLERGIES:  No Known Allergies  DISCHARGE MEDICATIONS:   Current Discharge Medication List    START taking these medications   Details  levETIRAcetam (KEPPRA) 500 MG tablet Take 1 tablet (500 mg total) by mouth 2 (two) times daily. Qty: 60 tablet,  Refills: 0      CONTINUE these medications which have NOT CHANGED   Details  acetaminophen (TYLENOL) 500 MG tablet Take 500 mg by mouth every 6 (six) hours as needed.    aspirin EC 81 MG tablet Take 81 mg by mouth daily.              Today   CHIEF COMPLAINT:   Doing well no seizures had a good birthday   VITAL SIGNS:  Blood pressure 134/85, pulse 98, temperature 98 F (36.7 C), temperature source Oral, resp. rate 19, height 5\' 5"  (1.651 m), weight 43.7 kg (96 lb 6.4 oz), SpO2 98 %.   REVIEW OF SYSTEMS:  Review of Systems  Unable to perform ROS: Other     PHYSICAL EXAMINATION:  GENERAL:  81 y.o.-year-old patient lying in the bed with no acute distress.  NECK:  Supple, no jugular venous distention. No thyroid enlargement, no tenderness.  LUNGS: Normal breath sounds bilaterally, no wheezing, rales,rhonchi  No use of accessory muscles of respiration.  CARDIOVASCULAR: S1, S2 normal. No murmurs, rubs, or gallops.  ABDOMEN: Soft, non-tender, non-distended. Bowel sounds present. No organomegaly or mass.  EXTREMITIES: No pedal edema, cyanosis, or clubbing.  PSYCHIATRIC: The patient is alert and oriented x name and place SKIN: No obvious rash, lesion, or ulcer.   DATA REVIEW:   CBC  Recent Labs Lab 04/25/16 0624  04/27/16 0406  WBC 8.6  --   --   HGB 12.8  < > 12.8  HCT 39.4  --   --  PLT 265  --   --   < > = values in this interval not displayed.  Chemistries   Recent Labs Lab 04/24/16 0956 04/25/16 0624  NA 139 137  K 3.2* 4.2  CL 103 107  CO2 21* 24  GLUCOSE 165* 81  BUN 14 12  CREATININE 0.86 0.92  CALCIUM 8.8* 8.7*  AST 70*  --   ALT 30  --   ALKPHOS 69  --   BILITOT 0.6  --     Cardiac Enzymes  Recent Labs Lab 04/24/16 0956  TROPONINI 0.06*    Microbiology Results  @[email or web address] @  RADIOLOGY:  Dg Abd 1 View  Result Date: 04/25/2016 CLINICAL DATA:  Abdominal pain. EXAM: ABDOMEN - 1 VIEW COMPARISON:  None. FINDINGS: The bowel  gas pattern is normal. No free air or free fluid. Minimal air in the bowel. Extensive arterial vascular calcifications in the abdomen. Severe degenerative disc and joint disease at L3-4 and to a lesser degree in the remainder of the lumbar spine. Probable fusion of the lower lumbar spine. Chronic interstitial disease at the lung bases consistent with pulmonary fibrosis with honeycombing. Right total hip prosthesis in place. Multiple surgical clips in the pelvis. IMPRESSION: Benign-appearing abdomen. Aortic atherosclerosis. Pulmonary fibrosis. Multilevel degenerative facet arthritis in the lumbar spine. Electronically Signed   By: Francene Boyers M.D.   On: 04/25/2016 14:59   Nm Gi Blood Loss  Result Date: 04/25/2016 CLINICAL DATA:  Blood in stool, history of hemorrhoids and diverticulitis EXAM: NUCLEAR MEDICINE GASTROINTESTINAL BLEEDING SCAN TECHNIQUE: Sequential abdominal images were obtained following intravenous administration of Tc-8m labeled red blood cells. RADIOPHARMACEUTICALS:  24.1 mCi Tc-36m in-vitro labeled red cells. COMPARISON:  None. FINDINGS: The patient initially started on her side, with bar moving across for body multiple times. By minute 26, with arms by her side and essentially in the supine position, there is curvilinear uptake along the left mid abdomen. However, this essentially remains unchanged for the remainder of the study (additional 15 minutes). As such, given lack of progression, this is favored to reflect uptake within a distended stomach rather than an active GI bleed. IMPRESSION: Suspected radiotracer uptake within a distended stomach in the left mid abdomen. No findings specific for active GI bleed. Electronically Signed   By: Charline Bills M.D.   On: 04/25/2016 17:26     Rounded with nursing Management plans discussed with the patient's daughter and she is in agreement. Stable for discharge snf  Patient should follow up with neurology  CODE STATUS:     Code  Status Orders        Start     Ordered   04/24/16 1530  Full code  Continuous     04/24/16 1529    Code Status History    Date Active Date Inactive Code Status Order ID Comments User Context   01/02/2016  8:03 PM 01/04/2016  5:18 PM Full Code [id number]  Houston Siren, MD Inpatient    Advance Directive Documentation   Flowsheet Row Most Recent Value  Type of Advance Directive  Healthcare Power of Attorney [POA Jaquiline Heyward (daughter)]  Pre-existing out of facility DNR order (yellow form or pink MOST form)  No data  "MOST" Form in Place?  No data      TOTAL TIME TAKING CARE OF THIS PATIENT: 36 minutes.    Note: This dictation was prepared with Dragon dictation along with smaller phrase technology. Any transcriptional errors that result from this process are unintentional.  Aneisa Karren M.D on 04/27/2016 at 8:28 AM  Between 7am to 6pm - Pager - [phone] After 6pm go to [email or web address] - Social research officer, governmentpassword [email or web address]  [facility] [place] [facility]  Office  [phone]  CC: Primary care physician; Tommie SamsJayce G Cook, DO

## 2016-04-26 NOTE — Care Management Important Message (Signed)
Important Message  Patient Details  Name: Julia Huffman MRN: [id number] Date of Birth: [birth year]   Medicare Important Message Given:  Yes    [name] A, RN 04/26/2016, 2:56 PM

## 2016-04-26 NOTE — Clinical Social Work Note (Signed)
Clinical Social Work Assessment  Patient Details  Name: Julia Huffman MRN: [id number] Date of Birth: 1935-04-22  Date of referral:  04/26/16               Reason for consult:  Facility Placement                Permission sought to share information with:  [facility]cean[facility] Contact Representative Permission granted to share information::  Yes, Verbal Permission Granted  Name::        Agency::     Relationship::     Contact Information:     Housing/Transportation Living arrangements for the past 2 months:  Single Family Home Source of Information:  Patient, Adult Children Patient Interpreter Needed:  None Criminal Activity/Legal Involvement Pertinent to Current Situation/Hospitalization:  No - Comment as needed Significant Relationships:  Adult Children, Church, [employer] Lives with:  Adult Children Do you feel safe going back to the place where you live?  Yes Need for family participation in patient care:  Yes (Comment)  Care giving concerns: STR   Social Worker assessment / plan:  CSW visited patient at bedside to conduct a mental status exam and to gain verbal permission to speak with her family. The patient was alert but only oriented to self. She gave verbal permission to speak with family about STR.  The patient's daughter Adela LankJacqueline gave verbal permission to conduct a SNF bed search. The patient ambulates with a cane and is independent with ADLs. No specific preference for a facility was given.    Employment status:  Retired Health and safety inspectornsurance information:  Medicare PT Recommendations:  Skilled Nursing Facility Information / Referral to community resources:  Skilled Nursing Facility  Patient/Family's Response to care:  Patient and her daughter thanked the CSW for assistance.  Patient/Family's Understanding of and Emotional Response to Diagnosis, Current Treatment, and Prognosis:  The patient has limited understanding of situation; the patient's daughter works in healthcare  (hospice) and is the main contact.  Emotional Assessment Appearance:  Appears older than stated age Attitude/Demeanor/Rapport:   (Very pleasant) Affect (typically observed):  Appropriate, Pleasant, Calm Orientation:  Oriented to Self (Patient believes it is 1980 and that she [place] in [place] living with her mother and spouse. ) Alcohol / Substance use:  Never Used Psych involvement (Current and /or in the community):  No (Comment)  Discharge Needs  Concerns to be addressed:  Discharge Planning Concerns Readmission within the last 30 days:  No Current discharge risk:  None Barriers to Discharge:  Continued Medical Work up   [employer] CorporationKaren M Roc Streett, LCSW 04/26/2016, 12:08 PM

## 2016-04-26 NOTE — [place] FL2 (Signed)
[place] MEDICAID FL2 LEVEL OF CARE SCREENING TOOL     IDENTIFICATION  Patient Name: Julia Huffman Birthdate: Feb 17, 1935 Sex: female Admission Date (Current Location): 04/24/2016  [place]   Facility and Address:  [facility], [address], [place], [place] [address]      Provider Number: [id number]  Attending Physician Name and Address:  Adrian SaranSital Mody, MD  Relative Name and Phone Number:       Current Level of Care: Hospital Recommended Level of Care: Skilled Nursing Facility Prior Approval Number:    Date Approved/Denied: 04/26/16 PASRR Number: [id number]  Discharge Plan: SNF    Current Diagnoses: Patient Active Problem List   Diagnosis Date Noted  . Gastrointestinal hemorrhage associated with gastritis   . Seizure (HCC) 04/24/2016  . Unresponsive   . Headache around the eyes 04/23/2016  . Confusion 04/23/2016  . CKD (chronic kidney disease) stage 3, GFR 30-59 ml/min 01/23/2016  . Hoarseness 01/23/2016  . Dysphagia 01/23/2016  . Osteoporosis 01/22/2016  . Postinflammatory pulmonary fibrosis (HCC) 01/15/2016  . Chronic respiratory failure (HCC) 01/15/2016    Orientation RESPIRATION BLADDER Height & Weight     Self  Normal Continent Weight: 96 lb 6.4 oz (43.7 kg) Height:  5\' 5"  (165.1 cm)  BEHAVIORAL SYMPTOMS/MOOD NEUROLOGICAL BOWEL NUTRITION STATUS    Convulsions/Seizures Continent    AMBULATORY STATUS COMMUNICATION OF NEEDS Skin   Extensive Assist Verbally Normal                       Personal Care Assistance Level of Assistance  Bathing, Dressing, Feeding Bathing Assistance: Maximum assistance Feeding assistance: Independent Dressing Assistance: Limited assistance     Functional Limitations Info             SPECIAL CARE FACTORS FREQUENCY  PT (By licensed PT)     PT Frequency: 5X day 5X week              Contractures Contractures Info: Present    Additional Factors  Info                  Current Medications (04/26/2016):  This is the current hospital active medication list Current Facility-Administered Medications  Medication Dose Route Frequency Provider Last Rate Last Dose  . acetaminophen (TYLENOL) tablet 650 mg  650 mg Oral Q6H PRN Auburn BilberryShreyang Patel, MD   650 mg at 04/25/16 1443   Or  . acetaminophen (TYLENOL) suppository 650 mg  650 mg Rectal Q6H PRN Auburn BilberryShreyang Patel, MD   650 mg at 04/24/16 2232  . amLODipine (NORVASC) tablet 10 mg  10 mg Oral Daily Adrian SaranSital Mody, MD   10 mg at 04/26/16 0956  . enoxaparin (LOVENOX) injection 30 mg  30 mg Subcutaneous Q24H Auburn BilberryShreyang Patel, MD   30 mg at 04/24/16 2034  . hydrALAZINE (APRESOLINE) injection 10 mg  10 mg Intravenous Q6H PRN Auburn BilberryShreyang Patel, MD   10 mg at 04/25/16 2242  . levETIRAcetam (KEPPRA) 500 mg in sodium chloride 0.9 % 100 mL IVPB  500 mg Intravenous Q12H Pauletta BrownsYuriy Zeylikman, MD   500 mg at 04/26/16 0552  . ondansetron (ZOFRAN) tablet 4 mg  4 mg Oral Q6H PRN Auburn BilberryShreyang Patel, MD       Or  . ondansetron (ZOFRAN) injection 4 mg  4 mg Intravenous Q6H PRN Auburn BilberryShreyang Patel, MD      . sodium chloride flush (NS) 0.9 % injection 3 mL  3 mL Intravenous Q12H Shreyang Patel,  MD   3 mL at 04/25/16 2242     Discharge Medications: Please see discharge summary for a list of discharge medications.  Relevant Imaging Results:  Relevant Lab Results:   Additional Information    Judi CongKaren M Murray Durrell, LCSW

## 2016-04-26 NOTE — Progress Notes (Addendum)
[facility]   PATIENT NAME: Julia Huffman    MR#:  [id number]  DATE OF BIRTH:  09-10-1934  SUBJECTIVE:    No GIB last night. No seizures reported overnight.  REVIEW OF SYSTEMS:    Review of Systems  Constitutional: Negative.  Negative for chills, fever and malaise/fatigue.  HENT: Negative.  Negative for ear discharge, ear pain, hearing loss, nosebleeds and sore throat.   Eyes: Negative.  Negative for blurred vision and pain.  Respiratory: Negative.  Negative for cough, hemoptysis, shortness of breath and wheezing.   Cardiovascular: Negative.  Negative for chest pain, palpitations and leg swelling.  Gastrointestinal: Negative.  Negative for abdominal pain, blood in stool, diarrhea, nausea and vomiting.  Genitourinary: Negative.  Negative for dysuria.  Musculoskeletal: Negative.  Negative for back pain.  Skin: Negative.   Neurological: Negative for dizziness, tremors, speech change, focal weakness, seizures and headaches.  Endo/Heme/Allergies: Negative.  Does not bruise/bleed easily.  Psychiatric/Behavioral: Positive for memory loss. Negative for depression, hallucinations and suicidal ideas.    Tolerating Diet: yes      DRUG ALLERGIES:  No Known Allergies  VITALS:  Blood pressure (!) 173/93, pulse 86, temperature 97.6 F (36.4 C), temperature source Oral, resp. rate 20, height 5\' 5"  (1.651 m), weight 43.7 kg (96 lb 6.4 oz), SpO2 98 %.  PHYSICAL EXAMINATION:   Physical Exam  Constitutional: She is well-developed, well-nourished, and in no distress. No distress.  frail  HENT:  Head: Normocephalic.  Eyes: No scleral icterus.  Neck: Normal range of motion. Neck supple. No JVD present. No tracheal deviation present.  Cardiovascular: Normal rate, regular rhythm and normal heart sounds.  Exam reveals no gallop and no friction rub.   No murmur heard. Pulmonary/Chest: Effort normal and breath sounds normal. No respiratory distress.  She has no wheezes. She has no rales. She exhibits no tenderness.  Abdominal: Soft. Bowel sounds are normal. She exhibits no distension and no mass. There is no tenderness. There is no rebound and no guarding.  Musculoskeletal: Normal range of motion. She exhibits no edema.  Neurological: She is alert.  Skin: Skin is warm. No rash noted. No erythema.      LABORATORY PANEL:   CBC  Recent Labs Lab 04/25/16 0624  04/26/16 0428  WBC 8.6  --   --   HGB 12.8  < > 14.7  HCT 39.4  --   --   PLT 265  --   --   < > = values in this interval not displayed. ------------------------------------------------------------------------------------------------------------------  Chemistries   Recent Labs Lab 04/24/16 0956 04/25/16 0624  NA 139 137  K 3.2* 4.2  CL 103 107  CO2 21* 24  GLUCOSE 165* 81  BUN 14 12  CREATININE 0.86 0.92  CALCIUM 8.8* 8.7*  AST 70*  --   ALT 30  --   ALKPHOS 69  --   BILITOT 0.6  --    ------------------------------------------------------------------------------------------------------------------  Cardiac Enzymes  Recent Labs Lab 04/24/16 0956  TROPONINI 0.06*   ------------------------------------------------------------------------------------------------------------------  RADIOLOGY:  Dg Chest 1 View  Result Date: 04/25/2016 CLINICAL DATA:  Pneumonia EXAM: CHEST 1 VIEW COMPARISON:  01/02/2016 FINDINGS: Coarse interstitial markings throughout both lungs are unchanged compatible with pulmonary fibrosis which is advanced. No superimposed pneumonia. Negative for heart failure or effusion. Apical scarring bilaterally. IMPRESSION: Diffuse pulmonary fibrosis stable.  No superimposed pneumonia. Electronically Signed   By: Marlan Palauharles  Clark M.D.   On: 04/25/2016 09:40   Dg  Abd 1 View  Result Date: 04/25/2016 CLINICAL DATA:  Abdominal pain. EXAM: ABDOMEN - 1 VIEW COMPARISON:  None. FINDINGS: The bowel gas pattern is normal. No free air or free fluid.  Minimal air in the bowel. Extensive arterial vascular calcifications in the abdomen. Severe degenerative disc and joint disease at L3-4 and to a lesser degree in the remainder of the lumbar spine. Probable fusion of the lower lumbar spine. Chronic interstitial disease at the lung bases consistent with pulmonary fibrosis with honeycombing. Right total hip prosthesis in place. Multiple surgical clips in the pelvis. IMPRESSION: Benign-appearing abdomen. Aortic atherosclerosis. Pulmonary fibrosis. Multilevel degenerative facet arthritis in the lumbar spine. Electronically Signed   By: Francene Boyers M.D.   On: 04/25/2016 14:59   Ct Head Wo Contrast  Result Date: 04/24/2016 CLINICAL DATA:  Headache, unresponsive EXAM: CT HEAD WITHOUT CONTRAST TECHNIQUE: Contiguous axial images were obtained from the base of the skull through the vertex without intravenous contrast. COMPARISON:  02/13/2016 FINDINGS: Brain: No intracranial hemorrhage, mass effect or midline shift. No acute cortical infarction. No mass lesion is noted on this unenhanced scan. Stable atrophy and chronic white matter disease. Vascular: Atherosclerotic calcifications of carotid siphon Skull: No skull fracture. Sinuses/Orbits: Paranasal sinuses and mastoid air cells are unremarkable. Other: None IMPRESSION: No acute intracranial abnormality. No definite acute cortical infarction. Stable atrophy and chronic white matter disease. Electronically Signed   By: Natasha Mead M.D.   On: 04/24/2016 09:52   Mr Brain Wo Contrast  Result Date: 04/24/2016 CLINICAL DATA:  Unresponsive.  Seizure. EXAM: MRI HEAD WITHOUT CONTRAST TECHNIQUE: Multiplanar, multiecho pulse sequences of the brain and surrounding structures were obtained without intravenous contrast. COMPARISON:  Head CT from earlier today FINDINGS: Brain: No acute infarction, hemorrhage, hydrocephalus, extra-axial collection or mass lesion. There is chronic cerebral microvascular ischemia that is moderate  for age. Age congruent cerebral volume loss. No generalized chronic blood products. 2 foci of susceptibility along the inferior right frontal lobe could be volume averaging of the skullbase. No cortical finding to explain seizure. Vascular: Normal flow voids. Skull and upper cervical spine: Cervical facet arthropathy. No focal marrow lesion. Sinuses/Orbits: Bilateral cataract resection.  No acute finding IMPRESSION: 1. No acute finding.  No cortical finding to explain seizure. 2. Moderate chronic microvascular disease. Electronically Signed   By: Marnee Spring M.D.   On: 04/24/2016 13:53   Nm Gi Blood Loss  Result Date: 04/25/2016 CLINICAL DATA:  Blood in stool, history of hemorrhoids and diverticulitis EXAM: NUCLEAR MEDICINE GASTROINTESTINAL BLEEDING SCAN TECHNIQUE: Sequential abdominal images were obtained following intravenous administration of Tc-34m labeled red blood cells. RADIOPHARMACEUTICALS:  24.1 mCi Tc-71m in-vitro labeled red cells. COMPARISON:  None. FINDINGS: The patient initially started on her side, with bar moving across for body multiple times. By minute 26, with arms by her side and essentially in the supine position, there is curvilinear uptake along the left mid abdomen. However, this essentially remains unchanged for the remainder of the study (additional 15 minutes). As such, given lack of progression, this is favored to reflect uptake within a distended stomach rather than an active GI bleed. IMPRESSION: Suspected radiotracer uptake within a distended stomach in the left mid abdomen. No findings specific for active GI bleed. Electronically Signed   By: Charline Bills M.D.   On: 04/25/2016 17:26     ASSESSMENT AND PLAN:    82 year old female found unresponsive and with new onset seizures.   1. Seziure of unclear etiology of seizures as  imaging MRI/CTH does not show acute abnormalities. She has no source of infection EEG shows generalized slowing and no seizure  activity As Per neurology recommendations she should continue Keppra 500 q12. Her primary care physician has already placed an outpatient neurology consultation due to signs of cognitive impairment/dementia.  2. Rectal bleeding: Hemoglobin is remained very stable. KUB and GI bleeding scan showed no acute abnormalities. Likely this was hemorrhoidal in nature.  3. History of pulmonary fibrosis: Chest x-ray did not show evidence of pneumonia and therefore she was not treated for pneumonia.   4. Elevated blood pressure noted diagnosis of hypertension: Add Norvasc due to ongoing elevation of blood pressure. Management plans discussed with the patient and she is in agreement.  CODE STATUS: full  TOTAL TIME TAKING CARE OF THIS PATIENT: 28 minutes.     POSSIBLE D/C SNF tomorrow, DEPENDING ON CLINICAL CONDITION.   Brytnee Bechler M.D on 04/26/2016 at 9:36 AM  Between 7am to 6pm - Pager - [phone] After 6pm go to [email or web address] - password [email or web address]  [facility]  Office  [phone]  CC: Primary care physician; Tommie SamsJayce G Cook, DO  Note: This dictation was prepared with Dragon dictation along with smaller phrase technology. Any transcriptional errors that result from this process are unintentional.

## 2016-04-26 NOTE — Consult Note (Signed)
Julia Miniumarren Indira Sorenson, MD [facility]  [address] [place], [place] [address] Phone: [phone] Fax : [phone]  Consultation  Referring Provider:     No ref. provider found Primary Care Physician:  Julia SamsJayce G Cook, DO Primary Gastroenterologist:           Reason for Consultation:     Rectal bleeding  Date of Admission:  04/24/2016 Date of Consultation:  04/26/2016         HPI:   Julia Huffman is a 81 y.o. female who was admitted with seizures. The patient has a history of vasculitis pulmonary fibrosis chronic kidney disease and hypertension and was found to be less responsive. The patient was thought to be having seizures. The patient is not a good historian and thinks that the years 1980 and that she is in [place] at the present time. During the patient's hospital stay she had a small amount of rectal bleeding with no drop in her hemoglobin. I'm now being asked to see this patient for rectal bleeding. The patient has had no further bleeding overnight.  Past Medical History:  Diagnosis Date  . Arthritis   . Chicken pox   . Chronic kidney disease   . History of vasculitis    History of Microscopic polyangitis  . Hypertension   . Osteoporosis   . Pulmonary fibrosis (HCC)     Past Surgical History:  Procedure Laterality Date  . BREAST SURGERY    . TOTAL HIP ARTHROPLASTY      Prior to Admission medications   Medication Sig Start Date End Date Taking? Authorizing Provider  acetaminophen (TYLENOL) 500 MG tablet Take 500 mg by mouth every 6 (six) hours as needed.    Historical Provider, MD  aspirin EC 81 MG tablet Take 81 mg by mouth daily.    Historical Provider, MD  levETIRAcetam (KEPPRA) 500 MG tablet Take 1 tablet (500 mg total) by mouth 2 (two) times daily. 04/26/16   Julia SaranSital Mody, MD    Family History  Problem Relation Age of Onset  . Heart attack Mother   . Arthritis Mother   . Heart disease Mother   . Hypertension Mother      Social History  Substance Use Topics  .  Smoking status: Former Smoker    Packs/day: 0.25    Years: 5.00    Types: Cigarettes    Quit date: 01/22/1991  . Smokeless tobacco: Never Used  . Alcohol use No    Allergies as of 04/24/2016  . (No Known Allergies)    Review of Systems:    All systems reviewed and negative except where noted in HPI.   Physical Exam:  Vital signs in last 24 hours: Temp:  [97.6 F (36.4 C)-98.4 F (36.9 C)] 97.6 F (36.4 C) (10/29 0628) Pulse Rate:  [27-96] 86 (10/29 0628) Resp:  [18-20] 20 (10/29 0423) BP: (151-187)/(79-138) 173/93 (10/29 0423) SpO2:  [98 %-100 %] 98 % (10/29 0628) Last BM Date: 04/25/16 General:   Pleasant, cooperative in NAD, confused Head:  Normocephalic and atraumatic. Eyes:   No icterus.   Conjunctiva pink. PERRLA. Ears:  Normal auditory acuity. Neck:  Supple; no masses or thyroidomegaly Lungs: Respirations even and unlabored. Lungs clear to auscultation bilaterally.   No wheezes, crackles, or rhonchi.  Heart:  Regular rate and rhythm;  Without murmur, clicks, rubs or gallops Abdomen:  Soft, nondistended, nontender. Normal bowel sounds. No appreciable masses or hepatomegaly.  No rebound or guarding.  Rectal:  Not performed. Msk:  Symmetrical without gross deformities.   Extremities:  Without edema, cyanosis or clubbing. Neurologic:  Alert not oriented to time or place;  grossly normal neurologically. Skin:  Intact without significant lesions or rashes. Cervical Nodes:  No significant cervical adenopathy. Psych:  Alert and cooperative. Normal affect.  LAB RESULTS:  Recent Labs  04/24/16 0956 04/25/16 0624  04/25/16 2142 04/26/16 0428 04/26/16 1027  WBC 9.9 8.6  --   --   --   --   HGB 11.9* 12.8  < > 13.1 14.7 13.7  HCT 35.7 39.4  --   --   --   --   PLT 258 265  --   --   --   --   < > = values in this interval not displayed. BMET  Recent Labs  04/24/16 0956 04/25/16 0624  NA 139 137  K 3.2* 4.2  CL 103 107  CO2 21* 24  GLUCOSE 165* 81  BUN 14 12   CREATININE 0.86 0.92  CALCIUM 8.8* 8.7*   LFT  Recent Labs  04/24/16 0956  PROT 7.0  ALBUMIN 3.4*  AST 70*  ALT 30  ALKPHOS 69  BILITOT 0.6   PT/INR No results for input(s): LABPROT, INR in the last 72 hours.  STUDIES: Dg Chest 1 View  Result Date: 04/25/2016 CLINICAL DATA:  Pneumonia EXAM: CHEST 1 VIEW COMPARISON:  01/02/2016 FINDINGS: Coarse interstitial markings throughout both lungs are unchanged compatible with pulmonary fibrosis which is advanced. No superimposed pneumonia. Negative for heart failure or effusion. Apical scarring bilaterally. IMPRESSION: Diffuse pulmonary fibrosis stable.  No superimposed pneumonia. Electronically Signed   By: Julia Palauharles  Clark M.D.   On: 04/25/2016 09:40   Dg Abd 1 View  Result Date: 04/25/2016 CLINICAL DATA:  Abdominal pain. EXAM: ABDOMEN - 1 VIEW COMPARISON:  None. FINDINGS: The bowel gas pattern is normal. No free air or free fluid. Minimal air in the bowel. Extensive arterial vascular calcifications in the abdomen. Severe degenerative disc and joint disease at L3-4 and to a lesser degree in the remainder of the lumbar spine. Probable fusion of the lower lumbar spine. Chronic interstitial disease at the lung bases consistent with pulmonary fibrosis with honeycombing. Right total hip prosthesis in place. Multiple surgical clips in the pelvis. IMPRESSION: Benign-appearing abdomen. Aortic atherosclerosis. Pulmonary fibrosis. Multilevel degenerative facet arthritis in the lumbar spine. Electronically Signed   By: Francene BoyersJames  Maxwell M.D.   On: 04/25/2016 14:59   Mr Brain Wo Contrast  Result Date: 04/24/2016 CLINICAL DATA:  Unresponsive.  Seizure. EXAM: MRI HEAD WITHOUT CONTRAST TECHNIQUE: Multiplanar, multiecho pulse sequences of the brain and surrounding structures were obtained without intravenous contrast. COMPARISON:  Head CT from earlier today FINDINGS: Brain: No acute infarction, hemorrhage, hydrocephalus, extra-axial collection or mass lesion.  There is chronic cerebral microvascular ischemia that is moderate for age. Age congruent cerebral volume loss. No generalized chronic blood products. 2 foci of susceptibility along the inferior right frontal lobe could be volume averaging of the skullbase. No cortical finding to explain seizure. Vascular: Normal flow voids. Skull and upper cervical spine: Cervical facet arthropathy. No focal marrow lesion. Sinuses/Orbits: Bilateral cataract resection.  No acute finding IMPRESSION: 1. No acute finding.  No cortical finding to explain seizure. 2. Moderate chronic microvascular disease. Electronically Signed   By: Julia SpringJonathon  Watts M.D.   On: 04/24/2016 13:53   Nm Gi Blood Loss  Result Date: 04/25/2016 CLINICAL DATA:  Blood in stool, history of hemorrhoids and diverticulitis EXAM: NUCLEAR MEDICINE GASTROINTESTINAL BLEEDING  SCAN TECHNIQUE: Sequential abdominal images were obtained following intravenous administration of Tc-58m labeled red blood cells. RADIOPHARMACEUTICALS:  24.1 mCi Tc-80m in-vitro labeled red cells. COMPARISON:  None. FINDINGS: The patient initially started on her side, with bar moving across for body multiple times. By minute 26, with arms by her side and essentially in the supine position, there is curvilinear uptake along the left mid abdomen. However, this essentially remains unchanged for the remainder of the study (additional 15 minutes). As such, given lack of progression, this is favored to reflect uptake within a distended stomach rather than an active GI bleed. IMPRESSION: Suspected radiotracer uptake within a distended stomach in the left mid abdomen. No findings specific for active GI bleed. Electronically Signed   By: Julia Huffman M.D.   On: 04/25/2016 17:26      Impression / Plan:   Tynasia Mccaul is a 81 y.o. y/o female with A small amount of rectal bleeding during her hospital stay. The patient is unable to give much history and not aware of where she is or the year. The  patient is also here for seizures and is being followed by neurology. Since the patient's rectal bleeding was such a small amounts without any drop in hemoglobin or hematocrit would advise not pursuing any further workup on this patient at this time. If the patient's condition substantially improves then the patient may elect to undergo further workup as an outpatient. I will sign off.  Please call if any further GI concerns or questions.  We would like to thank you for the opportunity to participate in the care of Julia Huffman.     Thank you for involving me in the care of this patient.      LOS: 2 days   Julia Minium, MD  04/26/2016, 11:28 AM   Note: This dictation was prepared with Dragon dictation along with smaller phrase technology. Any transcriptional errors that result from this process are unintentional.

## 2016-04-27 LAB — HEMOGLOBIN
HEMOGLOBIN: 12.8 g/dL (ref 12.0–16.0)
Hemoglobin: 13.1 g/dL (ref 12.0–16.0)

## 2016-04-27 NOTE — Progress Notes (Signed)
Pt d/c to [facility] via wheelchair with family escorted by staff

## 2016-04-27 NOTE — Progress Notes (Signed)
Subjective: Patient awake and alert.  No further seizures noted.    Objective: Current vital signs: BP 134/85 (BP Location: Left Arm)   Pulse 98   Temp 98 F (36.7 C) (Oral)   Resp 19   Ht 5\' 5"  (1.651 m)   Wt 43.7 kg (96 lb 6.4 oz)   SpO2 98%   BMI 16.04 kg/m  Vital signs in last 24 hours: Temp:  [97.7 F (36.5 C)-98.6 F (37 C)] 98 F (36.7 C) (10/30 0759) Pulse Rate:  [88-114] 98 (10/30 0759) Resp:  [18-19] 19 (10/30 0517) BP: (134-149)/(81-93) 134/85 (10/30 0759) SpO2:  [96 %-98 %] 98 % (10/30 0759)  Intake/Output from previous day: 10/29 0701 - 10/30 0700 In: 105 [IV Piggyback:105] Out: -  Intake/Output this shift: Total I/O In: 120 [P.O.:120] Out: -  Nutritional status: DIET SOFT Room service appropriate? Yes; Fluid consistency: Thin  Neurologic Exam: Mental Status: Alert.  Reports that she is in an office after being given four choices to chose from about where she is .  Speech fluent.  Follows simple commands.   Cranial Nerves: II: Discs flat bilaterally; Visual fields grossly normal, pupils equal, round, reactive to light and accommodation III,IV, VI: ptosis not present, extra-ocular motions intact bilaterally V,VII: smile symmetric, facial light touch sensation normal bilaterally VIII: hearing normal bilaterally IX,X: gag reflex present XI: bilateral shoulder shrug XII: midline tongue extension Motor: Lifts all extremities against gravity    Lab Results: Basic Metabolic Panel:  Recent Labs Lab 04/24/16 0956 04/25/16 0624  NA 139 137  K 3.2* 4.2  CL 103 107  CO2 21* 24  GLUCOSE 165* 81  BUN 14 12  CREATININE 0.86 0.92  CALCIUM 8.8* 8.7*    Liver Function Tests:  Recent Labs Lab 04/24/16 0956  AST 70*  ALT 30  ALKPHOS 69  BILITOT 0.6  PROT 7.0  ALBUMIN 3.4*   No results for input(s): LIPASE, AMYLASE in the last 168 hours. No results for input(s): AMMONIA in the last 168 hours.  CBC:  Recent Labs Lab 04/24/16 0956  04/25/16 0624  04/26/16 1027 04/26/16 1648 04/26/16 2210 04/27/16 0406 04/27/16 0941  WBC 9.9 8.6  --   --   --   --   --   --   NEUTROABS 8.1*  --   --   --   --   --   --   --   HGB 11.9* 12.8  < > 13.7 13.9 13.7 12.8 13.1  HCT 35.7 39.4  --   --   --   --   --   --   MCV 84.8 83.3  --   --   --   --   --   --   PLT 258 265  --   --   --   --   --   --   < > = values in this interval not displayed.  Cardiac Enzymes:  Recent Labs Lab 04/24/16 0956  TROPONINI 0.06*    Lipid Panel: No results for input(s): CHOL, TRIG, HDL, CHOLHDL, VLDL, LDLCALC in the last 168 hours.  CBG:  Recent Labs Lab 04/24/16 0928  GLUCAP 92    Microbiology: Results for orders placed or performed during the hospital encounter of 04/24/16  Urine culture     Status: None   Collection Time: 04/24/16  9:56 AM  Result Value Ref Range Status   Specimen Description URINE, RANDOM  Final   Special Requests NONE  Final  Culture NO GROWTH [facility]   Final   Report Status 04/25/2016 FINAL  Final    Coagulation Studies: No results for input(s): LABPROT, INR in the last 72 hours.  Imaging: Dg Abd 1 View  Result Date: 04/25/2016 CLINICAL DATA:  Abdominal pain. EXAM: ABDOMEN - 1 VIEW COMPARISON:  None. FINDINGS: The bowel gas pattern is normal. No free air or free fluid. Minimal air in the bowel. Extensive arterial vascular calcifications in the abdomen. Severe degenerative disc and joint disease at L3-4 and to a lesser degree in the remainder of the lumbar spine. Probable fusion of the lower lumbar spine. Chronic interstitial disease at the lung bases consistent with pulmonary fibrosis with honeycombing. Right total hip prosthesis in place. Multiple surgical clips in the pelvis. IMPRESSION: Benign-appearing abdomen. Aortic atherosclerosis. Pulmonary fibrosis. Multilevel degenerative facet arthritis in the lumbar spine. Electronically Signed   By: Francene BoyersJames  Maxwell M.D.   On:  04/25/2016 14:59   Nm Gi Blood Loss  Result Date: 04/25/2016 CLINICAL DATA:  Blood in stool, history of hemorrhoids and diverticulitis EXAM: NUCLEAR MEDICINE GASTROINTESTINAL BLEEDING SCAN TECHNIQUE: Sequential abdominal images were obtained following intravenous administration of Tc-626m labeled red blood cells. RADIOPHARMACEUTICALS:  24.1 mCi Tc-876m in-vitro labeled red cells. COMPARISON:  None. FINDINGS: The patient initially started on her side, with bar moving across for body multiple times. By minute 26, with arms by her side and essentially in the supine position, there is curvilinear uptake along the left mid abdomen. However, this essentially remains unchanged for the remainder of the study (additional 15 minutes). As such, given lack of progression, this is favored to reflect uptake within a distended stomach rather than an active GI bleed. IMPRESSION: Suspected radiotracer uptake within a distended stomach in the left mid abdomen. No findings specific for active GI bleed. Electronically Signed   By: Charline BillsSriyesh  Krishnan M.D.   On: 04/25/2016 17:26    Medications:  I have reviewed the patient's current medications. Scheduled: . amLODipine  10 mg Oral Daily  . enoxaparin (LOVENOX) injection  30 mg Subcutaneous Q24H  . levETIRAcetam  500 mg Intravenous Q12H  . sodium chloride flush  3 mL Intravenous Q12H    Assessment/Plan: No further seizures.  Patient likely close to baseline.  On Keppra.  Recommendations: 1.  Continue Keppra. 2.  Follow up with neurology on an outpatient basis.     LOS: 3 days   Thana FarrLeslie Skyllar Notarianni, MD Neurology [phone] 04/27/2016  12:11 PM

## 2016-04-27 NOTE — Progress Notes (Addendum)
2:21 PM Daughter returned call and has chosen [facility] for SNF. Patient will be transported by Daughter Facility agreeable to accept patient for discharge today.  All information sent through [employer].  10:01 AM Contact made with daughter. She is interested in [facility]. She has plans to go to facility at 11:00am to tour and potientally sign paperwork for admission. [facility] is aware and facility along with daughter will contact LCSW before 2:00pm. Patient will transport by daughter.  LCSW will begin work up for SNF discharge for today.   LCSW following for disposition. Attempted to call daughter as patient is only alert to self, unable to reach.  Unable to leave message due to mailbox being full.  Will continue to try and reach daughter to give bed offers and notify of DC.  Continue to follow and assist with DC.  Deretha EmoryHannah Morenike Cuff LCSW, MSW Clinical Social Work: Optician, dispensingystem [employer] Coverage for :  [phone]

## 2016-04-27 NOTE — Progress Notes (Signed)
Report called to [facility], pt to [facility] with family

## 2016-04-29 ENCOUNTER — Other Ambulatory Visit: Payer: Self-pay | Admitting: *Deleted

## 2016-04-29 DIAGNOSIS — J8410 Pulmonary fibrosis, unspecified: Secondary | ICD-10-CM

## 2016-04-29 NOTE — Progress Notes (Signed)
[facility] needs new order for pt's O2 due to renewal. Order placed. Nothing further needed.

## 2016-05-15 ENCOUNTER — Telehealth: Payer: Self-pay | Admitting: Family Medicine

## 2016-05-15 NOTE — Telephone Encounter (Signed)
Carollee HerterShannon from [facility] called and needed to get orders for patient for Ot, Speech therapy, Pt, and Home Health Aide. Please advise, thank you!  Call @ [phone]

## 2016-05-15 NOTE — Telephone Encounter (Signed)
PCP gave verbal okay to give a verbal order. Carollee HerterShannon was called and given those orders and will fax over form to sign.

## 2016-05-20 ENCOUNTER — Telehealth: Payer: Self-pay | Admitting: Family Medicine

## 2016-05-20 NOTE — Telephone Encounter (Signed)
Julia BraunKaren from [facility] called looking for verbal orders for speech therapy. Would like 2x week for 3 weeks and cognitive, and would reevaluate after that. She would like to start next week. Thank you!  Call QueenslandKaren @ [phone]

## 2016-05-20 NOTE — Telephone Encounter (Signed)
Julia Huffman was called back and given a verbal order.

## 2016-05-27 ENCOUNTER — Ambulatory Visit (INDEPENDENT_AMBULATORY_CARE_PROVIDER_SITE_OTHER): Payer: Medicare Other | Admitting: Family Medicine

## 2016-05-27 ENCOUNTER — Encounter: Payer: Self-pay | Admitting: Family Medicine

## 2016-05-27 DIAGNOSIS — R569 Unspecified convulsions: Secondary | ICD-10-CM

## 2016-05-27 MED ORDER — LEVETIRACETAM 500 MG PO TABS: 500.0000 mg | ORAL_TABLET | Freq: Two times a day (BID) | ORAL | 1 refills | Status: DC

## 2016-05-27 NOTE — Progress Notes (Signed)
Subjective:  Patient ID: Julia Huffman, female    DOB: 10-14-1934  Age: 81 y.o. MRN: [id number]  CC: Hospital follow up  HPI:  81 year old female with chronic respiratory failure secondary to pulmonary fibrosis, CKD presents for hospital follow-up.  Patient was recently admitted from 10/27-10/30. She presented unresponsiveness. She subsequently had witnessed seizure-like activity. She was admitted for further workup and evaluation. CT and MRI were unremarkable. EEG was negative. She was placed on Keppra and had no further seizure-like activity. Additionally, she had some rectal bleeding during admission but her hemoglobin remained stable. No identifiable source. Was presumed to be from hemorrhoid bleeding. Patient was discharged in stable condition.  Patient presents today for follow-up. She is compliant with Keppra. No further seizure-like activity. No other complaints or issues at this time. Family reports that she has seen neurology. Records are not available to me. Family member states that she was recently put on Remeron for appetite stimulation.  Social Hx   Social History   Social History  . Marital status: Widowed    Spouse name: N/A  . Number of children: N/A  . Years of education: N/A   Social History Main Topics  . Smoking status: Former Smoker    Packs/day: 0.25    Years: 5.00    Types: Cigarettes    Quit date: 01/22/1991  . Smokeless tobacco: Never Used  . Alcohol use No  . Drug use: No  . Sexual activity: Not Asked   Other Topics Concern  . None   Social History Narrative  . None    Review of Systems  Constitutional:       Appetite has increased.  Respiratory: Positive for shortness of breath.    Objective:  BP 139/88 (BP Location: Left Arm, Patient Position: Sitting, Cuff Size: Small)   Pulse (!) 103   Temp 97.6 F (36.4 C) (Oral)   Resp 10   Wt 97 lb (44 kg)   BMI 16.14 kg/m   BP/Weight 05/27/2016 04/27/2016 04/24/2016  Systolic BP 139 121 -    Diastolic BP 88 79 -  Wt. (Lbs) 97 - 96.4  BMI 16.14 - 16.04    Physical Exam  Constitutional:  Frail, chronically ill-appearing female. No acute distress.  HENT:  Head: Normocephalic and atraumatic.  Cardiovascular: Regular rhythm.  Tachycardia present.   Pulmonary/Chest:  Diffuse crackles.  Neurological: She is alert.  Vitals reviewed.  Lab Results  Component Value Date   WBC 8.6 04/25/2016   HGB 13.1 04/27/2016   HCT 39.4 04/25/2016   PLT 265 04/25/2016   GLUCOSE 81 04/25/2016   ALT 30 04/24/2016   AST 70 (H) 04/24/2016   NA 137 04/25/2016   K 4.2 04/25/2016   CL 107 04/25/2016   CREATININE 0.92 04/25/2016   BUN 12 04/25/2016   CO2 24 04/25/2016   HGBA1C 5.3 04/24/2016    Assessment & Plan:   Problem List Items Addressed This Visit    Seizure [facility](HCC)    New problem (to me). Uncertain etiology. Doing well on Keppra at this time. Refilled today. Will inquire about neurology records.      Relevant Medications   levETIRAcetam (KEPPRA) 500 MG tablet      Meds ordered this encounter  Medications  . mirtazapine (REMERON) 15 MG tablet    Sig: Take 15 mg by mouth at bedtime.  . levETIRAcetam (KEPPRA) 500 MG tablet    Sig: Take 1 tablet (500 mg total) by mouth 2 (two) times daily.  Dispense:  180 tablet    Refill:  1    Follow-up: As scheduled.  Everlene OtherJayce Haven Foss DO [facility]

## 2016-05-27 NOTE — Assessment & Plan Note (Signed)
New problem (to me). Uncertain etiology. Doing well on Keppra at this time. Refilled today. Will inquire about neurology records.

## 2016-05-27 NOTE — Patient Instructions (Signed)
Continue the meds.  Follow up as previously scheduled.  Take care  Dr. Adriana Simasook

## 2016-06-02 ENCOUNTER — Telehealth: Payer: Self-pay | Admitting: Family Medicine

## 2016-06-02 NOTE — Telephone Encounter (Signed)
Julia Huffman [phone] from [facility] regarding wanting to have the nurse call nurse back. Pt was really weak. Pt family states she was not weak. Julia Huffman would like to speak to the nurse about the new medication that prescribed and about how weak the pt is. Please advise?

## 2016-06-02 NOTE — Telephone Encounter (Signed)
Julia Huffman was worried about pt being that she is tired and not feeling overall well. Son-in-law stated that he was having to help her eat because she was to weak to left her hands to eat. Recommendations?

## 2016-06-02 NOTE — Telephone Encounter (Signed)
Given the dramatic change, she should be seen. If she is that weak, I would recommend she go to the hospital. Likely will need hospice care pending evaluation.

## 2016-06-03 ENCOUNTER — Telehealth: Payer: Self-pay | Admitting: *Deleted

## 2016-06-03 NOTE — Telephone Encounter (Signed)
Nurse was called and given Dr.Cook's notes. She stated she'd call the daughter and give her the information.

## 2016-06-03 NOTE — Telephone Encounter (Signed)
Daughter was told to go hospital due to her not getting better but she stated she was going to hold off for now bc she was participating in PT.

## 2016-06-03 NOTE — Telephone Encounter (Signed)
Carollee HerterShannon from [facility] requested to have the Rx for bed side commode refaxed. The Rx could not be read. This should be faxed to  [facility] in -phone [phone] [facility] office [phone]

## 2016-06-03 NOTE — Telephone Encounter (Signed)
I suspect this is further decline of her chronic illness. They should consider hospice care (if no focal issues/concerns they can wait on hospital eval).

## 2016-06-03 NOTE — Telephone Encounter (Signed)
Pt's daughter requested a returned call in reference to her mother going to the hospital  Contact [phone] or [phone]

## 2016-06-03 NOTE — Telephone Encounter (Signed)
rx was re-faxed.

## 2016-06-03 NOTE — Telephone Encounter (Signed)
Called the daughter and she stated that pt is still a little tired and weak. She stated PT came over and pt was interactive with PT. She declined taking pt right now to ER. Daughter stated that she is urinating quiet frequently and not taking in a lot of fluid. Night time time daughter is putting depend and maxi pad due to the amount of urine pt is voiding. Pt is not drinking the amount of fluid she is voiding out.

## 2016-06-09 ENCOUNTER — Telehealth: Payer: Self-pay | Admitting: *Deleted

## 2016-06-09 NOTE — Telephone Encounter (Signed)
[name] from [facility] has requested to have a Rx for pt to have a wheelchair Fax [phone] Atten Alan RipperClaire  Order:  transport Wheelchair for small adult  [employer]Contact [name] [phone]

## 2016-06-10 NOTE — Telephone Encounter (Signed)
Order faxed.

## 2016-06-10 NOTE — Telephone Encounter (Signed)
Done

## 2016-06-12 ENCOUNTER — Telehealth: Payer: Self-pay | Admitting: Family Medicine

## 2016-06-12 NOTE — Telephone Encounter (Signed)
Julia Huffman [phone] called from [facility] regarding needing verbal orders for speech therapy for four weeks and add in a special therapy OT therapy. Home health aid twice a week for four week starting next week. Thank you!

## 2016-06-15 ENCOUNTER — Telehealth: Payer: Self-pay | Admitting: Family Medicine

## 2016-06-15 NOTE — Telephone Encounter (Signed)
Form placed in Dr.Cooks read forms folder.

## 2016-06-15 NOTE — Telephone Encounter (Signed)
Pt's daughter dropped off [employer] forms to be completed by Dr. Adriana Simasook. Papers are up front in color folder.

## 2016-06-15 NOTE — Telephone Encounter (Signed)
Verbal orders okay

## 2016-06-15 NOTE — Telephone Encounter (Signed)
A voicemail was left on Julia Huffman's phone letting her know verbal was okay.

## 2016-06-15 NOTE — Telephone Encounter (Signed)
Yes

## 2016-06-16 NOTE — Telephone Encounter (Signed)
Pt daughter Harvel QualeJacquelyn called back and said that she spoke with the Neurology group and they stated that they sent the notes over already.  Call Jacquelyn @ [phone]

## 2016-06-16 NOTE — Telephone Encounter (Signed)
Form filled out

## 2016-06-16 NOTE — Telephone Encounter (Signed)
Daughter was called back and stated that neurology just faxed over paperwork

## 2016-06-16 NOTE — Telephone Encounter (Signed)
Paperwork was completed and given to pts daughter Adela LankJacqueline

## 2016-06-16 NOTE — Telephone Encounter (Signed)
Daughter was called in regards to picking up paper work. She stated that pt was recently [facility] with [facility]. Daughter was informed we have not received records. She stated she would call and have them sent over for Julia Huffman to add on dementia to the diagnosis on the form.

## 2016-06-26 ENCOUNTER — Telehealth: Payer: Self-pay | Admitting: Family Medicine

## 2016-06-26 NOTE — Telephone Encounter (Signed)
Spoke with daughter and fixed the problem in regards to paperwork and re-faxed.

## 2016-06-26 NOTE — Telephone Encounter (Signed)
PT daughter Adela Lankjacqueline called and wanted to know if we still have the original papers for [facility]. There is daughters name is not on there as emergency contact and we need to add it. Please advise, thank you!  Call [phone]

## 2016-06-27 ENCOUNTER — Emergency Department: Payer: Medicare Other

## 2016-06-27 ENCOUNTER — Inpatient Hospital Stay: Payer: Medicare Other

## 2016-06-27 ENCOUNTER — Inpatient Hospital Stay
Admission: EM | Admit: 2016-06-27 | Discharge: 2016-06-30 | DRG: 689 | Disposition: A | Discharge status: Home / Self Care | Payer: Medicare Other | Attending: Internal Medicine | Admitting: Internal Medicine

## 2016-06-27 DIAGNOSIS — E039 Hypothyroidism, unspecified: Secondary | ICD-10-CM | POA: Diagnosis present

## 2016-06-27 DIAGNOSIS — R4182 Altered mental status, unspecified: Secondary | ICD-10-CM

## 2016-06-27 DIAGNOSIS — Z8261 Family history of arthritis: Secondary | ICD-10-CM | POA: Diagnosis not present

## 2016-06-27 DIAGNOSIS — I129 Hypertensive chronic kidney disease with stage 1 through stage 4 chronic kidney disease, or unspecified chronic kidney disease: Secondary | ICD-10-CM | POA: Diagnosis present

## 2016-06-27 DIAGNOSIS — R29810 Facial weakness: Secondary | ICD-10-CM

## 2016-06-27 DIAGNOSIS — Z8249 Family history of ischemic heart disease and other diseases of the circulatory system: Secondary | ICD-10-CM | POA: Diagnosis not present

## 2016-06-27 DIAGNOSIS — R748 Abnormal levels of other serum enzymes: Secondary | ICD-10-CM | POA: Diagnosis present

## 2016-06-27 DIAGNOSIS — M810 Age-related osteoporosis without current pathological fracture: Secondary | ICD-10-CM | POA: Diagnosis present

## 2016-06-27 DIAGNOSIS — Z66 Do not resuscitate: Secondary | ICD-10-CM | POA: Diagnosis present

## 2016-06-27 DIAGNOSIS — N390 Urinary tract infection, site not specified: Secondary | ICD-10-CM | POA: Diagnosis not present

## 2016-06-27 DIAGNOSIS — M6281 Muscle weakness (generalized): Secondary | ICD-10-CM

## 2016-06-27 DIAGNOSIS — G9340 Encephalopathy, unspecified: Secondary | ICD-10-CM | POA: Diagnosis present

## 2016-06-27 DIAGNOSIS — G40909 Epilepsy, unspecified, not intractable, without status epilepticus: Secondary | ICD-10-CM | POA: Diagnosis present

## 2016-06-27 DIAGNOSIS — R7989 Other specified abnormal findings of blood chemistry: Secondary | ICD-10-CM

## 2016-06-27 DIAGNOSIS — N189 Chronic kidney disease, unspecified: Secondary | ICD-10-CM | POA: Diagnosis present

## 2016-06-27 DIAGNOSIS — Z7189 Other specified counseling: Secondary | ICD-10-CM

## 2016-06-27 DIAGNOSIS — R778 Other specified abnormalities of plasma proteins: Secondary | ICD-10-CM

## 2016-06-27 DIAGNOSIS — R4189 Other symptoms and signs involving cognitive functions and awareness: Secondary | ICD-10-CM | POA: Diagnosis not present

## 2016-06-27 DIAGNOSIS — Z96649 Presence of unspecified artificial hip joint: Secondary | ICD-10-CM | POA: Diagnosis present

## 2016-06-27 DIAGNOSIS — Z515 Encounter for palliative care: Secondary | ICD-10-CM | POA: Diagnosis not present

## 2016-06-27 DIAGNOSIS — R8281 Pyuria: Secondary | ICD-10-CM

## 2016-06-27 DIAGNOSIS — Z87891 Personal history of nicotine dependence: Secondary | ICD-10-CM

## 2016-06-27 DIAGNOSIS — F0390 Unspecified dementia without behavioral disturbance: Secondary | ICD-10-CM | POA: Diagnosis present

## 2016-06-27 DIAGNOSIS — E860 Dehydration: Secondary | ICD-10-CM | POA: Diagnosis present

## 2016-06-27 DIAGNOSIS — Z9889 Other specified postprocedural states: Secondary | ICD-10-CM

## 2016-06-27 DIAGNOSIS — Z79899 Other long term (current) drug therapy: Secondary | ICD-10-CM | POA: Diagnosis not present

## 2016-06-27 DIAGNOSIS — I160 Hypertensive urgency: Secondary | ICD-10-CM

## 2016-06-27 DIAGNOSIS — J8410 Pulmonary fibrosis, unspecified: Secondary | ICD-10-CM | POA: Diagnosis present

## 2016-06-27 DIAGNOSIS — Z7982 Long term (current) use of aspirin: Secondary | ICD-10-CM

## 2016-06-27 LAB — COMPREHENSIVE METABOLIC PANEL
ALBUMIN: 3.3 g/dL — AB (ref 3.5–5.0)
ALT: 12 U/L — AB (ref 14–54)
AST: 23 U/L (ref 15–41)
Alkaline Phosphatase: 65 U/L (ref 38–126)
Anion gap: 6 (ref 5–15)
BUN: 15 mg/dL (ref 6–20)
CHLORIDE: 104 mmol/L (ref 101–111)
CO2: 29 mmol/L (ref 22–32)
CREATININE: 0.84 mg/dL (ref 0.44–1.00)
Calcium: 9.6 mg/dL (ref 8.9–10.3)
GFR calc non Af Amer: >60 mL/min (ref >60)
GLUCOSE: 91 mg/dL (ref 65–99)
Potassium: 3.8 mmol/L (ref 3.5–5.1)
SODIUM: 139 mmol/L (ref 135–145)
Total Bilirubin: 0.9 mg/dL (ref 0.3–1.2)
Total Protein: 7.4 g/dL (ref 6.5–8.1)

## 2016-06-27 LAB — URINALYSIS, COMPLETE (UACMP) WITH MICROSCOPIC
BACTERIA UA: NONE SEEN
Bilirubin Urine: NEGATIVE
Glucose, UA: NEGATIVE mg/dL
HGB URINE DIPSTICK: NEGATIVE
Ketones, ur: NEGATIVE mg/dL
Nitrite: NEGATIVE
Specific Gravity, Urine: 1.017 (ref 1.005–1.030)
pH: 7 (ref 5.0–8.0)

## 2016-06-27 LAB — CBC WITH DIFFERENTIAL/PLATELET
Basophils Absolute: 0.1 10*3/uL (ref 0–0.1)
Basophils Relative: 1 %
EOS PCT: 0 %
Eosinophils Absolute: 0 10*3/uL (ref 0–0.7)
HCT: 36.8 % (ref 35.0–47.0)
HEMOGLOBIN: 12.5 g/dL (ref 12.0–16.0)
LYMPHS ABS: 1.3 10*3/uL (ref 1.0–3.6)
LYMPHS PCT: 14 %
MCH: 27.6 pg (ref 26.0–34.0)
MCHC: 34 g/dL (ref 32.0–36.0)
MCV: 81.1 fL (ref 80.0–100.0)
MONOS PCT: 5 %
Monocytes Absolute: 0.4 10*3/uL (ref 0.2–0.9)
Neutro Abs: 7.5 10*3/uL — ABNORMAL HIGH (ref 1.4–6.5)
Neutrophils Relative %: 80 %
PLATELETS: 282 10*3/uL (ref 150–440)
RBC: 4.53 MIL/uL (ref 3.80–5.20)
RDW: 14.1 % (ref 11.5–14.5)
WBC: 9.4 10*3/uL (ref 3.6–11.0)

## 2016-06-27 LAB — TSH: TSH: 1.328 u[IU]/mL (ref 0.350–4.500)

## 2016-06-27 LAB — TROPONIN I: Troponin I: 0.03 ng/mL (ref <0.03)

## 2016-06-27 LAB — AMMONIA: Ammonia: 12 umol/L (ref 9–35)

## 2016-06-27 LAB — LACTIC ACID, PLASMA: Lactic Acid, Venous: 0.9 mmol/L (ref 0.5–1.9)

## 2016-06-27 MED ORDER — SODIUM CHLORIDE 0.9 % IV SOLN
INTRAVENOUS | Status: AC
  Administered 2016-06-27 – 2016-06-28 (×2): via INTRAVENOUS

## 2016-06-27 MED ORDER — SODIUM CHLORIDE 0.9% FLUSH
3.0000 mL | Freq: Two times a day (BID) | INTRAVENOUS | Status: DC
  Administered 2016-06-27 – 2016-06-29 (×4): 3 mL via INTRAVENOUS

## 2016-06-27 MED ORDER — ALBUTEROL SULFATE (2.5 MG/3ML) 0.083% IN NEBU: 2.5000 mg | INHALATION_SOLUTION | RESPIRATORY_TRACT | Status: DC | PRN

## 2016-06-27 MED ORDER — LEVETIRACETAM 500 MG PO TABS: 500.0000 mg | ORAL_TABLET | Freq: Two times a day (BID) | ORAL | Status: DC

## 2016-06-27 MED ORDER — ONDANSETRON HCL 4 MG PO TABS: 4.0000 mg | ORAL_TABLET | Freq: Four times a day (QID) | ORAL | Status: DC | PRN

## 2016-06-27 MED ORDER — BISACODYL 10 MG RE SUPP
10.0000 mg | Freq: Every day | RECTAL | Status: DC | PRN
  Filled 2016-06-27: qty 1

## 2016-06-27 MED ORDER — ASPIRIN EC 81 MG PO TBEC
81.0000 mg | DELAYED_RELEASE_TABLET | Freq: Every day | ORAL | Status: DC
  Administered 2016-06-29 – 2016-06-30 (×2): 81 mg via ORAL
  Filled 2016-06-27 (×2): qty 1

## 2016-06-27 MED ORDER — ONDANSETRON HCL 4 MG/2ML IJ SOLN: 4.0000 mg | Freq: Four times a day (QID) | INTRAMUSCULAR | Status: DC | PRN

## 2016-06-27 MED ORDER — LABETALOL HCL 5 MG/ML IV SOLN
10.0000 mg | Freq: Once | INTRAVENOUS | Status: AC
  Administered 2016-06-27: 10 mg via INTRAVENOUS
  Filled 2016-06-27: qty 4

## 2016-06-27 MED ORDER — HYDRALAZINE HCL 20 MG/ML IJ SOLN
10.0000 mg | Freq: Four times a day (QID) | INTRAMUSCULAR | Status: DC | PRN
  Administered 2016-06-27 – 2016-06-28 (×2): 10 mg via INTRAVENOUS
  Filled 2016-06-27 (×2): qty 1

## 2016-06-27 MED ORDER — DEXTROSE 5 % IV SOLN: 1.0000 g | INTRAVENOUS | Status: DC

## 2016-06-27 MED ORDER — ENOXAPARIN SODIUM 30 MG/0.3ML ~~LOC~~ SOLN
30.0000 mg | SUBCUTANEOUS | Status: DC
  Administered 2016-06-27 – 2016-06-29 (×3): 30 mg via SUBCUTANEOUS
  Filled 2016-06-27 (×4): qty 0.3

## 2016-06-27 MED ORDER — ACETAMINOPHEN 650 MG RE SUPP: 650.0000 mg | Freq: Four times a day (QID) | RECTAL | Status: DC | PRN

## 2016-06-27 MED ORDER — ACETAMINOPHEN 325 MG PO TABS: 650.0000 mg | ORAL_TABLET | Freq: Four times a day (QID) | ORAL | Status: DC | PRN

## 2016-06-27 MED ORDER — CEFTRIAXONE SODIUM-DEXTROSE 1-3.74 GM-% IV SOLR
1.0000 g | INTRAVENOUS | Status: DC
  Administered 2016-06-28 – 2016-06-29 (×3): 1 g via INTRAVENOUS
  Filled 2016-06-27 (×4): qty 50

## 2016-06-27 MED ORDER — SODIUM CHLORIDE 0.9 % IV SOLN
500.0000 mg | Freq: Two times a day (BID) | INTRAVENOUS | Status: DC
  Administered 2016-06-27 – 2016-06-30 (×6): 500 mg via INTRAVENOUS
  Filled 2016-06-27 (×8): qty 5

## 2016-06-27 MED ORDER — HYDRALAZINE HCL 20 MG/ML IJ SOLN: 10.0000 mg | Freq: Four times a day (QID) | INTRAMUSCULAR | Status: DC | PRN

## 2016-06-27 NOTE — H&P (Signed)
[facility] [place] [facility]   PATIENT NAME: Julia Huffman    MR#:  [id number]  DATE OF BIRTH:  January 30, 1935  DATE OF ADMISSION:  06/27/2016  PRIMARY CARE PHYSICIAN: Tommie Sams, DO   REQUESTING/REFERRING PHYSICIAN: Dr. Shaune Pollack  CHIEF COMPLAINT:   Chief Complaint  Patient presents with  . Weakness    HISTORY OF PRESENT ILLNESS:  Julia Huffman  is a 81 y.o. female with a known history of Dementia, hypertension, pulmonary fibrosis, seizures and Keppra presents to the emergency room brought in by the daughter after patient returned extremely lethargic. She was unresponsive earlier today but presently opens her eyes partially. She was following some commands but presently babbles a few answers and not following commands. Afebrile. Normal WBC. UTI and dehydration.  Patient has lost 20 pounds over the past few months. Has slowly been declining. Has been eating 3 times a day but not much as per the daughter.  Family noticed some left facial droop and weakness earlier today.  PAST MEDICAL HISTORY:   Past Medical History:  Diagnosis Date  . Arthritis   . Chicken pox   . Chronic kidney disease   . History of vasculitis    History of Microscopic polyangitis  . Hypertension   . Osteoporosis   . Pulmonary fibrosis (HCC)     PAST SURGICAL HISTORY:   Past Surgical History:  Procedure Laterality Date  . BREAST SURGERY    . TOTAL HIP ARTHROPLASTY      SOCIAL HISTORY:   Social History  Substance Use Topics  . Smoking status: Former Smoker    Packs/day: 0.25    Years: 5.00    Types: Cigarettes    Quit date: 01/22/1991  . Smokeless tobacco: Never Used  . Alcohol use No    FAMILY HISTORY:   Family History  Problem Relation Age of Onset  . Heart attack Mother   . Arthritis Mother   . Heart disease Mother   . Hypertension Mother     DRUG ALLERGIES:  No Known Allergies  REVIEW OF SYSTEMS:   Review of Systems  Unable to perform ROS: Dementia     MEDICATIONS AT HOME:   Prior to Admission medications   Medication Sig Start Date End Date Taking? Authorizing Provider  aspirin EC 81 MG tablet Take 81 mg by mouth daily.   Yes Historical Provider, MD  levETIRAcetam (KEPPRA) 500 MG tablet Take 1 tablet (500 mg total) by mouth 2 (two) times daily. 05/27/16  Yes Tommie Sams, DO  mirtazapine (REMERON) 15 MG tablet Take 15 mg by mouth at bedtime.   Yes Historical Provider, MD  acetaminophen (TYLENOL) 500 MG tablet Take 500 mg by mouth every 6 (six) hours as needed.    Historical Provider, MD     VITAL SIGNS:  Blood pressure (!) 175/109, pulse 88, resp. rate 16, SpO2 100 %.  PHYSICAL EXAMINATION:  Physical Exam  GENERAL:  81 y.o.-year-old patient lying in the bed, Drowsy EYES: Pupils equal, round, reactive to light and accommodation. No scleral icterus. Extraocular muscles intact.  HEENT: Head atraumatic, normocephalic. Oropharynx and nasopharynx clear. No oropharyngeal erythema, moist oral mucosa  NECK:  Supple, no jugular venous distention. No thyroid enlargement, no tenderness.  LUNGS: Normal breath sounds bilaterally, no wheezing, rales, rhonchi. No use of accessory muscles of respiration.  CARDIOVASCULAR: S1, S2 normal. No murmurs, rubs, or gallops.  ABDOMEN: Soft, nontender, nondistended. Bowel sounds present. No organomegaly or mass.  EXTREMITIES: No pedal edema,  cyanosis, or clubbing. + 2 pedal & radial pulses b/l.   NEUROLOGIC: Left facial droop. Moves all 4 extremities. Not following commands. PSYCHIATRIC: The patient is drowsy. Partially opens her eyes on calling her name. SKIN: No obvious rash, lesion, or ulcer.   LABORATORY PANEL:   CBC  Recent Labs Lab 06/27/16 1401  WBC 9.4  HGB 12.5  HCT 36.8  PLT 282   ------------------------------------------------------------------------------------------------------------------  Chemistries   Recent Labs Lab 06/27/16 1401  NA 139  K 3.8  CL 104  CO2 29   GLUCOSE 91  BUN 15  CREATININE 0.84  CALCIUM 9.6  AST 23  ALT 12*  ALKPHOS 65  BILITOT 0.9   ------------------------------------------------------------------------------------------------------------------  Cardiac Enzymes  Recent Labs Lab 06/27/16 1401  TROPONINI 0.03*   ------------------------------------------------------------------------------------------------------------------  RADIOLOGY:  Ct Head Wo Contrast  Result Date: 06/27/2016 CLINICAL DATA:  Per family, pt has been more weak than normal and has had a decreased loc. AMS. Pt has end staged pulmonary fibrosis and kidney disease. Pt was unresponsive in CT. EXAM: CT HEAD WITHOUT CONTRAST TECHNIQUE: Contiguous axial images were obtained from the base of the skull through the vertex without intravenous contrast. COMPARISON:  Brain MRI and head CT, 04/24/2016 FINDINGS: Brain: No evidence of acute infarction, hemorrhage, hydrocephalus, extra-axial collection or mass lesion/mass effect. There is age related ventricular enlargement. Patchy white matter hypoattenuation is noted consistent with mild to moderate chronic microvascular ischemic change, stable. Vascular: No hyperdense vessel or unexpected calcification. Skull: Normal. Negative for fracture or focal lesion. Sinuses/Orbits: Visualize globes and orbits are unremarkable. Visualized sinuses and mastoid air cells are clear Other: None. IMPRESSION: 1. No acute intracranial abnormalities. 2. Age related volume loss. Mild to moderate chronic microvascular ischemic change. Electronically Signed   By: Amie Portlandavid  Ormond M.D.   On: 06/27/2016 14:45   Dg Chest Port 1 View  Result Date: 06/27/2016 CLINICAL DATA:  Altered mental status, unresponsive. Cough EXAM: PORTABLE CHEST 1 VIEW COMPARISON:  04/25/2016 FINDINGS: Cardiac silhouette is borderline enlarged. No mediastinal or hilar masses. No convincing adenopathy. Her chronic changes of bilateral interstitial fibrosis, stable from the  prior study. No evidence of acute pneumonia or pulmonary edema. No pleural effusion or pneumothorax. Skeletal structures are demineralized but grossly intact. IMPRESSION: 1. Chronic pulmonary fibrosis without significant change from the prior chest radiograph. 2. No acute cardiopulmonary disease. Electronically Signed   By: Amie Portlandavid  Ormond M.D.   On: 06/27/2016 14:46     IMPRESSION AND PLAN:   * Acute encephalopathy. Etiology is unclear. Likely CVA with a facial droop. Could also be due to dehydration or UTI. Admit to medical floor with telemetry monitoring. Neuro checks. MRI of the brain. Will need echocardiogram and current Dopplers if MRI positive for a new stroke. Physical therapy.  Also check B12, TSH, ammonia.  * Accelerated hypertension Presently blood pressure is 190/122. We'll give 1 dose of IV labetalol.  * UTI Start IV ceftriaxone  * Dehydration due to poor oral intake. Start IV fluids.  * History of seizures. Check Keppra level. Continue home Keppra  All the records are reviewed and case discussed with ED provider. Management plans discussed with the patient, family and they are in agreement.  CODE STATUS: DNR  TOTAL TIME TAKING CARE OF THIS PATIENT: 35 minutes.   Milagros LollSudini, Leticia Coletta R M.D on 06/27/2016 at 5:06 PM  Between 7am to 6pm - Pager - [phone]  After 6pm go to [email or web address] - password [email or web address] [facility]  [facility] [facility]  Office  [phone]  CC: Primary care physician; Tommie SamsJayce G Cook, DO  Note: This dictation was prepared with Dragon dictation along with smaller phrase technology. Any transcriptional errors that result from this process are unintentional.

## 2016-06-27 NOTE — ED Notes (Signed)
CRITICAL LAB: Troponin is 0.03, Thia, Lab, Dr. Shaune PollackLord notified, orders recieved

## 2016-06-27 NOTE — Progress Notes (Signed)
PHARMACIST - PHYSICIAN COMMUNICATION  CONCERNING:  Enoxaparin (Lovenox) for DVT Prophylaxis    RECOMMENDATION: Patient was prescribed enoxaprin 40mg  q24 hours for VTE prophylaxis.   Filed Weights   06/27/16 1907  Weight: 85 lb (38.6 kg)    Body mass index is 15.55 kg/m.  Estimated Creatinine Clearance: 33.1 mL/min (by C-G formula based on SCr of 0.84 mg/dL).  Based on [facility] Policy patient is candidate for enoxaparin 30mg  every 24 hours based on CrCl <4430ml/min or Weight less then 45kg for female and 50kg for female  DESCRIPTION: Pharmacy has adjusted enoxaparin dose per [facility] policy.  Patient is now receiving enoxaparin 30mg  every 24 hours.  Cher NakaiSheema Puanani Gene, PharmD Clinical Pharmacist  06/27/2016 7:23 PM

## 2016-06-27 NOTE — Progress Notes (Signed)
Advance care planning  Called and discussed with patient's daughter Lanell MatarJacqueline Heyward [facility]CPOA) over the phone from patient room in ED. Patient is confused.  Patient has had 20 lb weight loss in the last 6 months. She has had poor appetite. Does ambulate with a walker. Was diagnosed with dementia. On discussing CODE STATUS*confirms patient is a DO NOT RESUSCITATE and DO NOT INTUBATE. Does not want any aggressive measures. Would like to know the cause for her acute encephalopathy at this time. If there is no improvement she does not want any PEG tube, and wants the patient to be kept as comfortable as possible.  Time spent 20 minutes

## 2016-06-27 NOTE — ED Notes (Signed)
Patient transported to MRI 

## 2016-06-27 NOTE — ED Provider Notes (Signed)
[facility] Emergency Department Provider Note ____________________________________________   I have reviewed the triage vital signs and the triage nursing note.  HISTORY  Chief Complaint Weakness   Historian Patient is a limited historian due to altered mental status Daughter and other family members provide the history  HPI Julia Huffman is a 81 y.o. female who lives at home with her daughter, is brought in by her daughter due to altered mental status, difficulty to arouse this morning.Over the past one year the patient has lost 20 pounds per the family, and over the past 6 weeks there has been a new diagnosis of dementia and possibly history of "old stroke "per the family after seeing a neurologist, although the head scans were apparently read as normal or no acute stroke recently here at the hospital. In any case, patient was in her essentially baseline mental status which would include walking around on her own and alert and interactive last night around 11 PM when she went to bed. This morning she was difficult to arouse, it sounds like possibly only moaning, may be making some words. Not really opening her eyes. Family thought she might have a left-sided facial droop. They thought that her left arm looked contracted to them. She's never had an episode like this before.  Reportedly she has had some cough and congestion the last couple of days. No reported vomiting or diarrhea. Again she has poor by mouth intake at baseline recently.    Past Medical History:  Diagnosis Date  . Arthritis   . Chicken pox   . Chronic kidney disease   . History of vasculitis    History of Microscopic polyangitis  . Hypertension   . Osteoporosis   . Pulmonary fibrosis [facility](HCC)     Patient Active Problem List   Diagnosis Date Noted  . Seizure (HCC) 04/24/2016  . Headache around the eyes 04/23/2016  . CKD (chronic kidney disease) stage 3, GFR 30-59 ml/min 01/23/2016  .  Hoarseness 01/23/2016  . Dysphagia 01/23/2016  . Osteoporosis 01/22/2016  . Postinflammatory pulmonary fibrosis (HCC) 01/15/2016  . Chronic respiratory failure (HCC) 01/15/2016    Past Surgical History:  Procedure Laterality Date  . BREAST SURGERY    . TOTAL HIP ARTHROPLASTY      Prior to Admission medications   Medication Sig Start Date End Date Taking? Authorizing Provider  aspirin EC 81 MG tablet Take 81 mg by mouth daily.   Yes Historical Provider, MD  levETIRAcetam (KEPPRA) 500 MG tablet Take 1 tablet (500 mg total) by mouth 2 (two) times daily. 05/27/16  Yes Tommie SamsJayce G Cook, DO  mirtazapine (REMERON) 15 MG tablet Take 15 mg by mouth at bedtime.   Yes Historical Provider, MD  acetaminophen (TYLENOL) 500 MG tablet Take 500 mg by mouth every 6 (six) hours as needed.    Historical Provider, MD    No Known Allergies  Family History  Problem Relation Age of Onset  . Heart attack Mother   . Arthritis Mother   . Heart disease Mother   . Hypertension Mother     Social History Social History  Substance Use Topics  . Smoking status: Former Smoker    Packs/day: 0.25    Years: 5.00    Types: Cigarettes    Quit date: 01/22/1991  . Smokeless tobacco: Never Used  . Alcohol use No    Review of Systems  Constitutional: Negative for fever. Eyes: Negative for visual changes. ENT: Negative for sore throat. Cardiovascular: Negative  for chest pain. Respiratory: Negative for shortness of breath. Gastrointestinal: Negative for abdominal pain, vomiting and diarrhea. Genitourinary: Negative for dysuria. Musculoskeletal: Negative for back pain. Skin: Negative for rash. Neurological: Negative for headache. 10 point Review of Systems otherwise negative ____________________________________________   PHYSICAL EXAM:  VITAL SIGNS: ED Triage Vitals [06/27/16 1341]  Enc Vitals Group     BP (!) 191/101     Pulse Rate 100     Resp 20     Temp      Temp Source Oral     SpO2 97 %      Weight      Height      Head Circumference      Peak Flow      Pain Score      Pain Loc      Pain Edu?      Excl. in GC?      Constitutional: Does not answer verbally or open her eyes to voice, she does localize to pain.  Does not appear to be in distress. HEENT   Head: Normocephalic and atraumatic.      Eyes: Conjunctivae are normal. PERRL. Normal extraocular movements.      Ears:         Nose: No congestion/rhinnorhea.   Mouth/Throat: Mucous membranes are moderately dry.   Neck: No stridor. Cardiovascular/Chest: Normal rate, regular rhythm.  No murmurs, rubs, or gallops. Respiratory: Normal respiratory effort without tachypnea nor retractions. Breath sounds are clear and equal bilaterally. No wheezes/rales/rhonchi. Gastrointestinal: Soft. No distention, no guarding, no rebound. Nontender.    Genitourinary/rectal:Deferred Musculoskeletal: Not really cooperative with exam, but she does seem to be moving her arms and legs slightly on her own. Neurologic:  Not speaking or following commands, but she does occasionally say no her stop that one were placed in the IV. Speech does not necessarily sound slurred although again she is not speaking much. She does not open eyes to command. No appreciable facial droop to my exam, she is having her mouth open and so if anything it looks like her lip is drooping on both sides to me. Family reports that they think the left side of her face is drooping. Unable to evaluate sensation or motor exam is she is not able to cooperate and although she does appear to me to be moving small amounts of both arms and both legs, she is not doing much movement of her extremities at all.  She does withdraw to pain on both sides. Skin:  Skin is warm, dry and intact. No rash noted.   ____________________________________________  LABS (pertinent positives/negatives)  Labs Reviewed  COMPREHENSIVE METABOLIC PANEL - Abnormal; Notable for the following:        Result Value   Albumin 3.3 (*)    ALT 12 (*)    All other components within normal limits  CBC WITH DIFFERENTIAL/PLATELET - Abnormal; Notable for the following:    Neutro Abs 7.5 (*)    All other components within normal limits  TROPONIN I - Abnormal; Notable for the following:    Troponin I 0.03 (*)    All other components within normal limits  URINE CULTURE  CULTURE, BLOOD (ROUTINE X 2)  CULTURE, BLOOD (ROUTINE X 2)  LACTIC ACID, PLASMA  URINALYSIS, COMPLETE (UACMP) WITH MICROSCOPIC    ____________________________________________    EKG I, Governor Rooksebecca Luberta Grabinski, MD, the attending physician have personally viewed and interpreted all ECGs.  97 bpm normal sinus rhythm. Narrow QRS. Normal axis. Normal ST  and T-wave ____________________________________________  RADIOLOGY All Xrays were viewed by me. Imaging interpreted by Radiologist.  Chest x-ray portable:    CLINICAL DATA: Altered mental status, unresponsive. Cough  EXAM: PORTABLE CHEST 1 VIEW  COMPARISON: 04/25/2016  FINDINGS: Cardiac silhouette is borderline enlarged. No mediastinal or hilar masses. No convincing adenopathy.  Her chronic changes of bilateral interstitial fibrosis, stable from the prior study. No evidence of acute pneumonia or pulmonary edema. No pleural effusion or pneumothorax.  Skeletal structures are demineralized but grossly intact.  IMPRESSION: 1. Chronic pulmonary fibrosis without significant change from the prior chest radiograph. 2. No acute cardiopulmonary disease.       CT head without contrast:  IMPRESSION: 1. No acute intracranial abnormalities. 2. Age related volume loss. Mild to moderate chronic microvascular ischemic change. __________________________________________  PROCEDURES  Procedure(s) performed: None  Critical Care performed: CRITICAL CARE Performed by: Governor Rooks   Total critical care time: 30 minutes  Critical care time was exclusive of separately billable  procedures and treating other patients.  Critical care was necessary to treat or prevent imminent or life-threatening deterioration.  Critical care was time spent personally by me on the following activities: development of treatment plan with patient and/or surrogate as well as nursing, discussions with consultants, evaluation of patient's response to treatment, examination of patient, obtaining history from patient or surrogate, ordering and performing treatments and interventions, ordering and review of laboratory studies, ordering and review of radiographic studies, pulse oximetry and re-evaluation of patient's condition.   ____________________________________________   ED COURSE / ASSESSMENT AND PLAN  Pertinent labs & imaging results that were available during my care of the patient were reviewed by me and considered in my medical decision making (see chart for details).   Ms. Kakos is brought in by her family for altered mental status, difficulty to arouse this morning. They were somewhat concerned about possibility of left-sided facial droop and possibly of stroke. She does not get treated for high blood pressure and her blood pressure today is in the 190s over 100s. I discussed with them the possibility of stroke although last time of onset was 11 PM and so she is outside of any TPA stroke window. I did call CT to have her CT head done quickly. I discussed with family that if acute ischemic stroke, there would be some permissible hypertension.   CT head negative for acute finding.  Additional blood pressures are running 220s over 120s, patient will be given labetalol. Again she does not have a history of hypertension. I will treat for hypertensive urgency with IV doses of labetalol.  Laboratory studies are reassuring. No additional suspicion at this point for sepsis or infectious cause of altered mental status although urinalysis is still pending.   CONSULTATIONS:   Hospitalist for  admission   Patient / Family / Caregiver informed of clinical course, medical decision-making process, and agree with plan.   ___________________________________________   FINAL CLINICAL IMPRESSION(S) / ED DIAGNOSES   Final diagnoses:  Altered mental status, unspecified altered mental status type  Hypertensive urgency              Note: This dictation was prepared with Dragon dictation. Any transcriptional errors that result from this process are unintentional    Governor Rooks, MD 06/27/16 1536

## 2016-06-27 NOTE — ED Notes (Signed)
Pt transported to room 210 

## 2016-06-27 NOTE — ED Triage Notes (Signed)
Pt came to ED via EMS from home. Per family, pt has been more weak than normal and has had a decreased loc. Pt has end staged pulmonary fibrosis and kidney disease. PT answering some questions on arrival. HR 96, temperature 98.2. Reports " I hurt everywhere."

## 2016-06-28 LAB — BASIC METABOLIC PANEL
Anion gap: 8 (ref 5–15)
BUN: 13 mg/dL (ref 6–20)
CHLORIDE: 103 mmol/L (ref 101–111)
CO2: 26 mmol/L (ref 22–32)
CREATININE: 0.63 mg/dL (ref 0.44–1.00)
Calcium: 9.3 mg/dL (ref 8.9–10.3)
GFR calc non Af Amer: >60 mL/min (ref >60)
GLUCOSE: 128 mg/dL — AB (ref 65–99)
Potassium: 3.5 mmol/L (ref 3.5–5.1)
Sodium: 137 mmol/L (ref 135–145)

## 2016-06-28 LAB — URINE CULTURE: Culture: NO GROWTH

## 2016-06-28 LAB — VITAMIN B12: Vitamin B-12: 299 pg/mL (ref 180–914)

## 2016-06-28 NOTE — Plan of Care (Signed)
Problem: Education: Goal: Knowledge of [facility] Education information/materials will improve Outcome: Not Progressing Patient has dementia and is not responsive.  Problem: Health Behavior/Discharge Planning: Goal: Ability to manage health-related needs will improve Outcome: Not Progressing Patient has dementia.  Problem: Activity: Goal: Risk for activity intolerance will decrease Outcome: Not Progressing Patient is not ambulating right now.  Problem: Fluid Volume: Goal: Ability to maintain a balanced intake and output will improve Outcome: Not Progressing Patient is NPO.  Problem: Nutrition: Goal: Adequate nutrition will be maintained Outcome: Not Progressing Patient is NPO.

## 2016-06-28 NOTE — Progress Notes (Signed)
Called Dr. Anne HahnWillis regarding changing po dose of Keppra to iv since patient is NPO and responds to pain only.  Appropriate orders were placed.  Arturo MortonClay, [place] Spees N  06/28/2016  2:57 AM

## 2016-06-28 NOTE — Progress Notes (Signed)
[facility] [place] [facility]   PATIENT NAME: Julia Huffman    MRN#:  [id number]  DATE OF BIRTH:  1934/09/10  SUBJECTIVE:  Hospital Day: 1 day Julia Huffman is a 81 y.o. female presenting with Weakness .   Overnight events: No acute overnight events Interval Events: Unable to obtain given patient's mental status  REVIEW OF SYSTEMS:  Unable to obtain given patient's mental status   DRUG ALLERGIES:  No Known Allergies  VITALS:  Blood pressure (!) 152/90, pulse 96, temperature 97.8 F (36.6 C), temperature source Axillary, resp. rate (!) 32, height 5\' 2"  (1.575 m), weight 40.5 kg (89 lb 3.2 oz), SpO2 100 %.  PHYSICAL EXAMINATION:   VITAL SIGNS: Vitals:   06/28/16 0615 06/28/16 1046  BP: (!) 153/87 (!) 152/90  Pulse: (!) 105 96  Resp:    Temp:     GENERAL:81 y.o.female moderate distress given mental status.  HEAD: Normocephalic, atraumatic.  EYES: Pupils equal, round, reactive to light. Unable to assess extraocular muscles given mental status/medical condition. No scleral icterus.  MOUTH: Moist mucosal membrane. Dentition intact. No abscess noted.  EAR, NOSE, THROAT: Clear without exudates. No external lesions.  NECK: Supple. No thyromegaly. No nodules. No JVD.  PULMONARY: Clear to ascultation, without wheeze rails or rhonci. No use of accessory muscles, Good respiratory effort. good air entry bilaterally CHEST: Nontender to palpation.  CARDIOVASCULAR: S1 and S2. Regular rate and rhythm. No murmurs, rubs, or gallops. No edema. Pedal pulses 2+ bilaterally.  GASTROINTESTINAL: Soft, nontender, nondistended. No masses. Positive bowel sounds. No hepatosplenomegaly.  MUSCULOSKELETAL: No swelling, clubbing, or edema. Range of motion full in all extremities.  NEUROLOGIC: Unable to assess given mental status/medical condition SKIN: No ulceration, lesions, rashes, or cyanosis. Skin warm and dry. Turgor intact.  PSYCHIATRIC: Unable to assess given  mental status/medical condition      LABORATORY PANEL:   CBC  Recent Labs Lab 06/27/16 1401  WBC 9.4  HGB 12.5  HCT 36.8  PLT 282   ------------------------------------------------------------------------------------------------------------------  Chemistries   Recent Labs Lab 06/27/16 1401 06/28/16 0557  NA 139 137  K 3.8 3.5  CL 104 103  CO2 29 26  GLUCOSE 91 128*  BUN 15 13  CREATININE 0.84 0.63  CALCIUM 9.6 9.3  AST 23  --   ALT 12*  --   ALKPHOS 65  --   BILITOT 0.9  --    ------------------------------------------------------------------------------------------------------------------  Cardiac Enzymes  Recent Labs Lab 06/27/16 1401  TROPONINI 0.03*   ------------------------------------------------------------------------------------------------------------------  RADIOLOGY:  Ct Head Wo Contrast  Result Date: 06/27/2016 CLINICAL DATA:  Per family, pt has been more weak than normal and has had a decreased loc. AMS. Pt has end staged pulmonary fibrosis and kidney disease. Pt was unresponsive in CT. EXAM: CT HEAD WITHOUT CONTRAST TECHNIQUE: Contiguous axial images were obtained from the base of the skull through the vertex without intravenous contrast. COMPARISON:  Brain MRI and head CT, 04/24/2016 FINDINGS: Brain: No evidence of acute infarction, hemorrhage, hydrocephalus, extra-axial collection or mass lesion/mass effect. There is age related ventricular enlargement. Patchy white matter hypoattenuation is noted consistent with mild to moderate chronic microvascular ischemic change, stable. Vascular: No hyperdense vessel or unexpected calcification. Skull: Normal. Negative for fracture or focal lesion. Sinuses/Orbits: Visualize globes and orbits are unremarkable. Visualized sinuses and mastoid air cells are clear Other: None. IMPRESSION: 1. No acute intracranial abnormalities. 2. Age related volume loss. Mild to moderate chronic microvascular ischemic change.  Electronically Signed  By: Amie Portlandavid  Ormond M.D.   On: 06/27/2016 14:45   Mr Brain Wo Contrast  Result Date: 06/27/2016 CLINICAL DATA:  Dementia, hypertension and seizures with acute presentation of lethargy. EXAM: MRI HEAD WITHOUT CONTRAST TECHNIQUE: Multiplanar, multiecho pulse sequences of the brain and surrounding structures were obtained without intravenous contrast. COMPARISON:  CT same day.  MRI 04/24/2016 FINDINGS: Brain: Diffusion imaging does not show any acute or subacute infarction. The brainstem and cerebellum are normal. Cerebral hemispheres show age related atrophy with moderate chronic small-vessel ischemic changes of the deep and subcortical white matter. No cortical or large vessel territory infarction. No mass lesion, hemorrhage, hydrocephalus or extra-axial collection. Vascular: Major vessels at the base of the brain show flow. Skull and upper cervical spine: Negative Sinuses/Orbits: Clear/normal Other: None significant IMPRESSION: No acute or reversible finding. Chronic atrophy and chronic small-vessel ischemic changes of the cerebral hemispheric white matter, similar to the previous exam. Electronically Signed   By: Paulina FusiMark  Shogry M.D.   On: 06/27/2016 18:11   Dg Chest Port 1 View  Result Date: 06/27/2016 CLINICAL DATA:  Altered mental status, unresponsive. Cough EXAM: PORTABLE CHEST 1 VIEW COMPARISON:  04/25/2016 FINDINGS: Cardiac silhouette is borderline enlarged. No mediastinal or hilar masses. No convincing adenopathy. Her chronic changes of bilateral interstitial fibrosis, stable from the prior study. No evidence of acute pneumonia or pulmonary edema. No pleural effusion or pneumothorax. Skeletal structures are demineralized but grossly intact. IMPRESSION: 1. Chronic pulmonary fibrosis without significant change from the prior chest radiograph. 2. No acute cardiopulmonary disease. Electronically Signed   By: Amie Portlandavid  Ormond M.D.   On: 06/27/2016 14:46    EKG:   Orders placed or  performed during the hospital encounter of 01/02/16  . EKG 12-Lead  . EKG 12-Lead  . EKG    ASSESSMENT AND PLAN:   Julia Huffman is a 81 y.o. female presenting with Weakness . Admitted 06/27/2016 : Day #: 1 day  1. Acute encephalopathy, secondary to urinary tract infection in the setting of baseline dementia: Continue treatment for UTI follow culture data and adjust antibiotics accordingly, if not waking or change in mental status could get EEG for subclinical seizures though this seems unlikely 2. Accelerated hypertension: Home medications seems somewhat better controlled today 3. Hypothyroidism unspecified: Synthroid 4. Seizure disorder: Continue Keppra, check level  Disposition: Consult palliative care for goals of care, given progressive decline   All the records are reviewed and case discussed with Care Management/Social Workerr. Management plans discussed with the patient, family and they are in agreement.  CODE STATUS: dnr TOTAL TIME TAKING CARE OF THIS PATIENT: 28 minutes.   POSSIBLE D/C IN 2-3DAYS, DEPENDING ON CLINICAL CONDITION.   Jahmez Bily,  Mardi MainlandDavid K M.D on 06/28/2016 at 11:26 AM  Between 7am to 6pm - Pager - [phone]  After 6pm: House Pager: - [phone]  [facility]  Office  [phone]  CC: Primary care physician; Tommie SamsJayce G Cook, DO

## 2016-06-29 MED ORDER — DEXTROSE-NACL 5-0.9 % IV SOLN
INTRAVENOUS | Status: DC
  Administered 2016-06-29 – 2016-06-30 (×3): via INTRAVENOUS

## 2016-06-29 NOTE — Progress Notes (Signed)
Patient tolerated diet well. Patient needed to be fed jello, however was able to hold the container with [place] to feed herself.

## 2016-06-29 NOTE — Evaluation (Signed)
Physical Therapy Evaluation Patient Details Name: Julia Huffman MRN: [id number] DOB: 1934/11/14 Today's Date: 06/29/2016   History of Present Illness  Patient is a 82 y/o female that presents with decreased responsiveness. Noted to have L facial droop by family. She has a history of dementia and CVA. She has lost 20# in the last year.   Clinical Impression  Patient at baseline is able to provide effort (requires assistance) with transfers, and can perform limited household mobility. She is presently still confused and intermittently able to follow commands and provide effort with transfers and mobility. She demonstrates poor sitting balance initially, but is able to sit independently for a brief period of time. Her linens were noticed to be wet as PT had her sit at the EOB, after changing linens, patient was too fatigued to provide any additional effort towards OOB mobility (max transfer supine to sit and max A in sitting), thus returned to supine. Patient fell asleep quickly after being returned to supine. Patient at this point is not at her baseline level of mobility and would benefit from SNF placement at this time.     Follow Up Recommendations SNF    Equipment Recommendations  Rolling walker with 5" wheels    Recommendations for Other Services       Precautions / Restrictions Precautions Precautions: Fall Restrictions Weight Bearing Restrictions: No      Mobility  Bed Mobility Overal bed mobility: Needs Assistance Bed Mobility: Supine to Sit;Sit to Supine     Supine to sit: Mod assist;Max assist Sit to supine: Mod assist;Max assist   General bed mobility comments: Patient is quite fatigued and does not seem to comprehend transfer concept as she is initially resistant. Requires virtually max A x1.   Transfers                 General transfer comment: Attempted to transfer, patient found to be wet. By the time team was done cleaning patient was falling asleep and  returned to supine.   Ambulation/Gait                Stairs            Wheelchair Mobility    Modified Rankin (Stroke Patients Only)       Balance Overall balance assessment: Needs assistance;History of Falls Sitting-balance support: Bilateral upper extremity supported;Feet supported Sitting balance-Leahy Scale: Poor Sitting balance - Comments: Patient is able to sit independently for brief periods, but requires therapist to position in tall sitting and secure for long periods.  Postural control: Posterior lean                                   Pertinent Vitals/Pain Pain Assessment: No/denies pain    Home Living Family/patient expects to be discharged to:: Private residence Living Arrangements: Children Available Help at Discharge: Family (Lives with daughter(s)) Type of Home: House Home Access: Level entry     Home Layout: Two level;Able to live on main level with bedroom/bathroom Home Equipment: Walker - 4 wheels;Bedside commode;Grab bars - toilet Additional Comments: Per previous encounter, patient had a fall in the shower in the past year and now only takes sink baths.     Prior Function Level of Independence: Independent with assistive device(s)         Comments: per old notes she is able to use a rollator walker for short distances 10-15 feet with rest breaks  between bouts of this distance.      Hand Dominance        Extremity/Trunk Assessment   Upper Extremity Assessment Upper Extremity Assessment: Generalized weakness    Lower Extremity Assessment Lower Extremity Assessment: Generalized weakness       Communication   Communication:  (Mumbles throughout session)  Cognition Arousal/Alertness: Awake/alert;Lethargic Behavior During Therapy: WFL for tasks assessed/performed Overall Cognitive Status: History of cognitive impairments - at baseline                 General Comments: History of dementia, difficulty  following commands.     General Comments      Exercises     Assessment/Plan    PT Assessment Patient needs continued PT services  PT Problem List Decreased strength;Decreased balance;Decreased cognition;Decreased knowledge of use of DME;Decreased mobility;Decreased activity tolerance;Decreased safety awareness          PT Treatment Interventions DME instruction;Balance training;Therapeutic activities;Gait training;Stair training;Therapeutic exercise;Neuromuscular re-education;Patient/family education    PT Goals (Current goals can be found in the Care Plan section)  Acute Rehab PT Goals Patient Stated Goal: To return home when able  PT Goal Formulation: With patient/family Time For Goal Achievement: 07/13/16 Potential to Achieve Goals: Good    Frequency Min 2X/week   Barriers to discharge        Co-evaluation               End of Session Equipment Utilized During Treatment: Gait belt Activity Tolerance: Patient tolerated treatment well;Patient limited by lethargy Patient left: in bed;with call bell/phone within reach;with bed alarm set Nurse Communication: Mobility status         Time: [id number] PT Time Calculation (min) (ACUTE ONLY): 20 min   Charges:   PT Evaluation $PT Eval Moderate Complexity: 1 Procedure     PT G Codes:       Kerin RansomPatrick A McNamara, PT, DPT    06/29/2016, 4:48 PM

## 2016-06-29 NOTE — Progress Notes (Addendum)
[facility] [place] at [facility]   PATIENT NAME: Julia Huffman    MR#:  [id number]  DATE OF BIRTH:  1935-04-09  SUBJECTIVE:   Pt. Here due to AMS, Weakness. Noted to have UTI but urine cultures are (-).  No acute events overnight.   REVIEW OF SYSTEMS:    Review of Systems  Unable to perform ROS: Dementia    Nutrition: Clear liquid Tolerating Diet: YEs Tolerating PT: Await Eval.    DRUG ALLERGIES:  No Known Allergies  VITALS:  Blood pressure (!) 144/76, pulse 93, temperature 98.8 F (37.1 C), temperature source Axillary, resp. rate 20, height 5\' 2"  (1.575 m), weight 40.7 kg (89 lb 12.8 oz), SpO2 99 %.  PHYSICAL EXAMINATION:   Physical Exam  GENERAL:  82 y.o.-year-old cachectic/demented patient lying in bed in no acute distress.  EYES: Pupils equal, round, reactive to light and accommodation. No scleral icterus. Extraocular muscles intact.  HEENT: Head atraumatic, normocephalic. Oropharynx and nasopharynx clear.  NECK:  Supple, no jugular venous distention. No thyroid enlargement, no tenderness.  LUNGS: Normal breath sounds bilaterally, no wheezing, rales, rhonchi. No use of accessory muscles of respiration.  CARDIOVASCULAR: S1, S2 normal. No murmurs, rubs, or gallops.  ABDOMEN: Soft, nontender, nondistended. Bowel sounds present. No organomegaly or mass.  EXTREMITIES: No cyanosis, clubbing or edema b/l.    NEUROLOGIC: Cranial nerves II through XII are intact. No focal Motor or sensory deficits b/l. Globally weak  PSYCHIATRIC: The patient is alert and oriented x 1.  SKIN: No obvious rash, lesion, or ulcer.    LABORATORY PANEL:   CBC  Recent Labs Lab 06/27/16 1401  WBC 9.4  HGB 12.5  HCT 36.8  PLT 282   ------------------------------------------------------------------------------------------------------------------  Chemistries   Recent Labs Lab 06/27/16 1401 06/28/16 0557  NA 139 137  K 3.8 3.5  CL 104 103  CO2 29 26  GLUCOSE  91 128*  BUN 15 13  CREATININE 0.84 0.63  CALCIUM 9.6 9.3  AST 23  --   ALT 12*  --   ALKPHOS 65  --   BILITOT 0.9  --    ------------------------------------------------------------------------------------------------------------------  Cardiac Enzymes  Recent Labs Lab 06/27/16 1401  TROPONINI 0.03*   ------------------------------------------------------------------------------------------------------------------  RADIOLOGY:  Mr Brain Wo Contrast  Result Date: 06/27/2016 CLINICAL DATA:  Dementia, hypertension and seizures with acute presentation of lethargy. EXAM: MRI HEAD WITHOUT CONTRAST TECHNIQUE: Multiplanar, multiecho pulse sequences of the brain and surrounding structures were obtained without intravenous contrast. COMPARISON:  CT same day.  MRI 04/24/2016 FINDINGS: Brain: Diffusion imaging does not show any acute or subacute infarction. The brainstem and cerebellum are normal. Cerebral hemispheres show age related atrophy with moderate chronic small-vessel ischemic changes of the deep and subcortical white matter. No cortical or large vessel territory infarction. No mass lesion, hemorrhage, hydrocephalus or extra-axial collection. Vascular: Major vessels at the base of the brain show flow. Skull and upper cervical spine: Negative Sinuses/Orbits: Clear/normal Other: None significant IMPRESSION: No acute or reversible finding. Chronic atrophy and chronic small-vessel ischemic changes of the cerebral hemispheric white matter, similar to the previous exam. Electronically Signed   By: Paulina Fusi M.D.   On: 06/27/2016 18:11     ASSESSMENT AND PLAN:   82 year old female with advanced dementia pulmonary fibrosis, hypertension, osteoporosis, history of vasculitis who presented to the hospital due to altered mental status.  1. Altered mental status/encephalopathy-etiology unclear but suspected due to worsening advanced dementia. -Patient has had an extensive neurologic workup  including  CT head, MRI of the brain which has been negative. -Urine cultures are negative, but continue IV antibiotics empirically for underlying UTI. -Continue to follow mental status.  2. Urinary tract infection-continue empiric IV ceftriaxone, urine cultures so far negative.  3. Accelerated hypertension-continue as needed IV hydralazine.  4. History of seizures-no acute seizure type activity. Continue Keppra.  If not improving consider Palliative care consult for goals of care.   All the records are reviewed and case discussed with Care Management/Social Worker. Management plans discussed with the patient, family and they are in agreement.  CODE STATUS: DNR  DVT Prophylaxis: Lovenox  TOTAL TIME TAKING CARE OF THIS PATIENT: 25 minutes.   POSSIBLE D/C IN 2-3 DAYS, DEPENDING ON CLINICAL CONDITION.   Houston SirenSAINANI,Keyante Durio J M.D on 06/29/2016 at 3:15 PM  Between 7am to 6pm - Pager - [phone]  After 6pm go to [email or web address] - Social research officer, governmentpassword EPAS [facility]  [facility]  Office  [phone]  CC: Primary care physician; Tommie SamsJayce G Cook, DO

## 2016-06-30 ENCOUNTER — Telehealth: Payer: Self-pay | Admitting: *Deleted

## 2016-06-30 DIAGNOSIS — R404 Transient alteration of awareness: Secondary | ICD-10-CM

## 2016-06-30 DIAGNOSIS — Z7189 Other specified counseling: Secondary | ICD-10-CM

## 2016-06-30 DIAGNOSIS — Z515 Encounter for palliative care: Secondary | ICD-10-CM

## 2016-06-30 DIAGNOSIS — J8410 Pulmonary fibrosis, unspecified: Secondary | ICD-10-CM

## 2016-06-30 DIAGNOSIS — R7989 Other specified abnormal findings of blood chemistry: Secondary | ICD-10-CM

## 2016-06-30 DIAGNOSIS — R4189 Other symptoms and signs involving cognitive functions and awareness: Secondary | ICD-10-CM

## 2016-06-30 DIAGNOSIS — R778 Other specified abnormalities of plasma proteins: Secondary | ICD-10-CM

## 2016-06-30 DIAGNOSIS — R8281 Pyuria: Secondary | ICD-10-CM

## 2016-06-30 LAB — TROPONIN I: TROPONIN I: 0.04 ng/mL — AB (ref <0.03)

## 2016-06-30 MED ORDER — ORAL CARE MOUTH RINSE
15.0000 mL | Freq: Two times a day (BID) | OROMUCOSAL | Status: DC
  Administered 2016-06-30: 15 mL via OROMUCOSAL

## 2016-06-30 MED ORDER — ADULT MULTIVITAMIN W/MINERALS CH: 1.0000 | ORAL_TABLET | Freq: Every day | ORAL | 5 refills | Status: AC

## 2016-06-30 MED ORDER — ADULT MULTIVITAMIN W/MINERALS CH
1.0000 | ORAL_TABLET | Freq: Every day | ORAL | Status: DC
  Administered 2016-06-30: 1 via ORAL
  Filled 2016-06-30: qty 1

## 2016-06-30 MED ORDER — ENSURE ENLIVE PO LIQD: 237.0000 mL | Freq: Two times a day (BID) | ORAL | 12 refills | Status: AC

## 2016-06-30 MED ORDER — LISINOPRIL 5 MG PO TABS: 5.0000 mg | ORAL_TABLET | Freq: Every day | ORAL | 5 refills | Status: DC

## 2016-06-30 MED ORDER — LEVETIRACETAM 750 MG PO TABS: 750.0000 mg | ORAL_TABLET | Freq: Two times a day (BID) | ORAL | 5 refills | Status: AC

## 2016-06-30 MED ORDER — ENSURE ENLIVE PO LIQD
237.0000 mL | Freq: Two times a day (BID) | ORAL | Status: DC
  Administered 2016-06-30: 237 mL via ORAL

## 2016-06-30 NOTE — Progress Notes (Signed)
Physical Therapy Treatment Patient Details Name: Julia Huffman MRN: [id number] DOB: 08-19-1934 Today's Date: 06/30/2016    History of Present Illness Patient is a 82 y/o female that presents with decreased responsiveness. Noted to have L facial droop by family. She has a history of dementia and CVA. She has lost 20# in the last year.     PT Comments    Pt alert during session.  Pt min assist supine to sit and CGA to min assist with transfers and short ambulation distance in room with RW (mostly requiring physical assist d/t pt impulsive and intermittently "dancing" and making random impulsive moves when standing or walking).  Anticipate pt is close to her baseline and will do better in her home environment.  Recommend pt discharge home with 24/7 assist for functional mobility/ambulation for safety (CM and SW updated).   Follow Up Recommendations  Supervision/Assistance - 24 hour     Equipment Recommendations  Rolling walker with 5" wheels    Recommendations for Other Services       Precautions / Restrictions Precautions Precautions: Fall Restrictions Weight Bearing Restrictions: No    Mobility  Bed Mobility Overal bed mobility: Needs Assistance Bed Mobility: Supine to Sit     Supine to sit: Min assist;HOB elevated     General bed mobility comments: increased effort and time to perform  Transfers Overall transfer level: Needs assistance   Transfers: Sit to/from Stand;Stand Pivot Transfers Sit to Stand: Min guard;Min assist Stand pivot transfers: Min guard;Min assist       General transfer comment: pt with strong stand with RW; CGA to min assist for safety and occasionally for balance d/t pt impulsive with activity  Ambulation/Gait Ambulation/Gait assistance: Min guard;Min assist Ambulation Distance (Feet): 15 Feet Assistive device: Rolling walker (2 wheeled)   Gait velocity: decreased   General Gait Details: pt requiring vc's and tactile cues for hand  placement on walker; pt impulsive with ambulation (occasionally "dancing") requiring increased assist for safety   Stairs            Wheelchair Mobility    Modified Rankin (Stroke Patients Only)       Balance Overall balance assessment: Needs assistance;History of Falls Sitting-balance support: Bilateral upper extremity supported;Feet supported Sitting balance-Leahy Scale: Fair     Standing balance support: No upper extremity supported Standing balance-Leahy Scale: Fair Standing balance comment: donning/doffing depends in standing position                    Cognition Arousal/Alertness: Awake/alert Behavior During Therapy: Impulsive Overall Cognitive Status: No family/caregiver present to determine baseline cognitive functioning                 General Comments: History of dementia, difficulty following commands.     Exercises      General Comments   Nursing cleared pt for participation in physical therapy.  Pt agreeable to PT session and requesting to toilet.      Pertinent Vitals/Pain Pain Assessment: Faces Faces Pain Scale: No hurt    Home Living                      Prior Function            PT Goals (current goals can now be found in the care plan section) Acute Rehab PT Goals Patient Stated Goal: To return home when able  PT Goal Formulation: With patient/family Time For Goal Achievement: 07/13/16 Potential to Achieve Goals: Good Progress  towards PT goals: Progressing toward goals    Frequency    Min 2X/week      PT Plan Discharge plan needs to be updated    Co-evaluation             End of Session Equipment Utilized During Treatment: Gait belt;Oxygen Activity Tolerance: Patient tolerated treatment well Patient left: in chair;with call bell/phone within reach;with chair alarm set;with nursing/sitter in room     Time: 1218-1241 PT Time Calculation (min) (ACUTE ONLY): 23 min  Charges:  $Therapeutic  Activity: 23-37 mins                    G CodesHendricks Limes:      Finis Hendricksen 06/30/2016, 1:37 PM Hendricks LimesEmily Raford Brissett, PT [phone]

## 2016-06-30 NOTE — Progress Notes (Signed)
Initial Nutrition Assessment  DOCUMENTATION CODES:   Severe malnutrition in context of chronic illness, Underweight  INTERVENTION:  Recommend liberalizing heart healthy/low sodium dietary restrictions for patient to only have Dysphagia 2 diet with thin liquids.  Provide Ensure Enlive po BID, each supplement provides 350 kcal and 20 grams of protein. Ensure is lactose-free so will be suitable for patient.  Provide multivitamin with minerals daily.  NUTRITION DIAGNOSIS:   Malnutrition (Severe) related to chronic illness as evidenced by 11 percent weight loss over 4 months, severe depletion of body fat, severe depletion of muscle mass.  GOAL:   Patient will meet greater than or equal to 90% of their needs  MONITOR:   PO intake, Supplement acceptance, Labs, Weight trends, I & O's  REASON FOR ASSESSMENT:   Other (Comment) (Low BMI)    ASSESSMENT:   82 y.o. female with a known history of Dementia, hypertension, pulmonary fibrosis, seizures and Keppra presents to the emergency room brought in by the daughter after patient returned extremely lethargic. Found to have AMS/encephalopathy with unclear etiology.    Patient unable to participate in nutrition assessment in setting of dementia. No family at bedside. Per chart family reporting patient still eats 3 times per day but eating smaller portions. Also reports patient has lost 20 lbs over the past few months. However, per chart patient has only lost 11 lbs (11% body weight) over 4 months, which is still significant for time frame. Spoke with RN who reports patient is lactose-intolerant.  Medications reviewed and include: ceftriaxone, D5-NS @ 75 ml/hr (90 grams dextrose, 306 kcal daily).   Labs reviewed: Glucose 128, elevated Troponin.  Nutrition-Focused physical exam completed. Findings are severe fat depletion, severe muscle depletion, and no edema.   Diet Order:  DIET DYS 2 Room service appropriate? Yes; Fluid consistency:  Thin Diet - low sodium heart healthy  Skin:  Reviewed, no issues  Last BM:  Unknown  Height:   Ht Readings from Last 1 Encounters:  06/27/16 5\' 2"  (1.575 m)    Weight:   Wt Readings from Last 1 Encounters:  06/30/16 89 lb 8 oz (40.6 kg)    Ideal Body Weight:  50 kg  BMI:  Body mass index is 16.37 kg/m.  Estimated Nutritional Needs:   Kcal:  1130-1315 (HBE x 1.2-1.4)  Protein:  60-70 grams (1.5-1.7 grams/kg)  Fluid:  >/= 1.2 L/day (30 ml/kg)  EDUCATION NEEDS:   No education needs identified at this time  Helane RimaLeanne Denae Zulueta, MS, RD, LDN Pager: [phone] After Hours Pager: [phone]

## 2016-06-30 NOTE — Telephone Encounter (Signed)
[facility] Patient has been scheduled for a  FU

## 2016-06-30 NOTE — Discharge Summary (Signed)
[facility] [place] [facility]   PATIENT NAME: Julia Huffman    MR#:  [id number]  DATE OF BIRTH:  12-17-1934  DATE OF ADMISSION:  06/27/2016 ADMITTING PHYSICIAN: Milagros LollSrikar Sudini, MD  DATE OF DISCHARGE: No discharge date for patient encounter.  PRIMARY CARE PHYSICIAN: Tommie SamsJayce G Cook, DO     ADMISSION DIAGNOSIS:  Facial droop [R29.810] Hypertensive urgency [I16.0] Altered mental status, unspecified altered mental status type [R41.82]  DISCHARGE DIAGNOSIS:  Principal Problem:   Unresponsive episode Active Problems:   Acute encephalopathy   Pyuria, sterile   Elevated troponin   SECONDARY DIAGNOSIS:   Past Medical History:  Diagnosis Date  . Arthritis   . Chicken pox   . Chronic kidney disease   . History of vasculitis    History of Microscopic polyangitis  . Hypertension   . Osteoporosis   . Pulmonary fibrosis (HCC)     .pro HOSPITAL COURSE:   Patient is 82 year old African-American female with past medical history significant for history of arthritis, CK D, vasculitis, hypertension, pulmonary fibrosis, who presents to the hospital with complaints of unresponsive episode. Apparently patient was difficult to wake up,, she was very lethargic. She was brought to emergency room for further evaluation and treatment and was admitted. Patient's family admitted that patient has lost about 20 pounds over the past few months due to poor oral intake. Patient was afebrile on arrival to the hospital, white blood cell count was normal. She underwent CT of the head without contrast as well as MRI of the brain, which were unremarkable, but chronic changes. With conservative therapy. Patient's condition improved and she returned back to normal self. I discussed patient's case with neurologist, Dr. Thad Rangereynolds, who recommended to advance patient's Keppra to 750 mg twice daily dose, follow-up with neurologist, Dr. Clelia CroftShaw as outpatient. Patient was seen by physical  therapist and recommended no follow-up. Patient was felt to be stable to be discharged home Discussion by problem: #1. Unresponsive episode, etiology remains unclear, advancing Keppra to 750 g twice daily dose, follow-up with neurologist, Dr. Clelia CroftShaw for further recommendations, MRI of the brain was unremarkable, but chronic changes #2. Pyuria, sterile, no antibody therapy was recommended, blood cultures were negative 2 #3. Essential hypertension, blood pressure was elevated in the hospital, however, according to patient's daughter was normal at home, patient was advised to follow up with cardiologist as outpatient due to episode of unresponsiveness to rule out arrhythmias, especially in view of mild minimally elevated troponin on admission, no arrhythmias were noted during hospitalization #4. History of seizure disorder, advancing Keppra to 750 mg twice daily dose, follow-up with neurologist for recommendations, no electroencephalogram was recommended by Dr. Thad Rangereynolds #5. Elevated troponin, unclear etiology, possibly demand ischemia due to episode of unresponsiveness, rule out arrhythmias, cardiac consultation as outpatient was recommended, discussed this patient's daughter, who is agreeable. #6. Generalized weakness, patient was evaluated by physical therapist and recommended no physical therapy. Follow-up as outpatient DISCHARGE CONDITIONS:   Stable  CONSULTS OBTAINED:  Treatment Team:  Wyatt Hasteavid K Hower, MD  DRUG ALLERGIES:  No Known Allergies  DISCHARGE MEDICATIONS:   Current Discharge Medication List    START taking these medications   Details  feeding supplement, ENSURE ENLIVE, (ENSURE ENLIVE) LIQD Take 237 mLs by mouth 2 (two) times daily between meals. Qty: 237 mL, Refills: 12    Multiple Vitamin (MULTIVITAMIN WITH MINERALS) TABS tablet Take 1 tablet by mouth daily. Qty: 30 tablet, Refills: 5      CONTINUE these  medications which have CHANGED   Details  levETIRAcetam (KEPPRA) 750  MG tablet Take 1 tablet (750 mg total) by mouth 2 (two) times daily. Qty: 60 tablet, Refills: 5      CONTINUE these medications which have NOT CHANGED   Details  aspirin EC 81 MG tablet Take 81 mg by mouth daily.    mirtazapine (REMERON) 15 MG tablet Take 15 mg by mouth at bedtime.    acetaminophen (TYLENOL) 500 MG tablet Take 500 mg by mouth every 6 (six) hours as needed.         DISCHARGE INSTRUCTIONS:    The patient is to follow-up with primary care physician, neurologist, cardiologist as outpatient  If you experience worsening of your admission symptoms, develop shortness of breath, life threatening emergency, suicidal or homicidal thoughts you must seek medical attention immediately by calling 911 or calling your MD immediately  if symptoms less severe.  You Must read complete instructions/literature along with all the possible adverse reactions/side effects for all the Medicines you take and that have been prescribed to you. Take any new Medicines after you have completely understood and accept all the possible adverse reactions/side effects.   Please note  You were cared for by a hospitalist during your hospital stay. If you have any questions about your discharge medications or the care you received while you were in the hospital after you are discharged, you can call the unit and asked to speak with the hospitalist on call if the hospitalist that took care of you is not available. Once you are discharged, your primary care physician will handle any further medical issues. Please note that NO REFILLS for any discharge medications will be authorized once you are discharged, as it is imperative that you return to your primary care physician (or establish a relationship with a primary care physician if you do not have one) for your aftercare needs so that they can reassess your need for medications and monitor your lab values.    Today   CHIEF COMPLAINT:   Chief Complaint   Patient presents with  . Weakness    HISTORY OF PRESENT ILLNESS:  Aliciana Ricciardi  is a 82 y.o. female with a known history of arthritis, CK D, vasculitis, hypertension, pulmonary fibrosis, who presents to the hospital with complaints of unresponsive episode. Apparently patient was difficult to wake up,, she was very lethargic. She was brought to emergency room for further evaluation and treatment and was admitted. Patient's family admitted that patient has lost about 20 pounds over the past few months due to poor oral intake. Patient was afebrile on arrival to the hospital, white blood cell count was normal. She underwent CT of the head without contrast as well as MRI of the brain, which were unremarkable, but chronic changes. With conservative therapy. Patient's condition improved and she returned back to normal self. I discussed patient's case with neurologist, Dr. Thad Ranger, who recommended to advance patient's Keppra to 750 mg twice daily dose, follow-up with neurologist, Dr. Clelia Croft as outpatient. Patient was seen by physical therapist and recommended no follow-up. Patient was felt to be stable to be discharged home Discussion by problem: #1. Unresponsive episode, etiology remains unclear, advancing Keppra to 750 g twice daily dose, follow-up with neurologist, Dr. Clelia Croft for further recommendations, MRI of the brain was unremarkable, but chronic changes #2. Pyuria, sterile, no antibody therapy was recommended, blood cultures were negative 2 #3. Essential hypertension, blood pressure was elevated in the hospital, however, according to patient's  daughter was normal at home, patient was advised to follow up with cardiologist as outpatient due to episode of unresponsiveness to rule out arrhythmias, especially in view of mild minimally elevated troponin on admission, no arrhythmias were noted during hospitalization #4. History of seizure disorder, advancing Keppra to 750 mg twice daily dose, follow-up with  neurologist for recommendations, no electroencephalogram was recommended by Dr. Thad Ranger #5. Elevated troponin, unclear etiology, possibly demand ischemia due to episode of unresponsiveness, rule out arrhythmias, cardiac consultation as outpatient was recommended, discussed this patient's daughter, who is agreeable. #6. Generalized weakness, patient was evaluated by physical therapist and recommended no physical therapy. Follow-up as outpatient    VITAL SIGNS:  Blood pressure (!) 147/81, pulse 76, temperature 97.8 F (36.6 C), temperature source Oral, resp. rate (!) 28, height 5\' 2"  (1.575 m), weight 40.6 kg (89 lb 8 oz), SpO2 91 %.  I/O:   Intake/Output Summary (Last 24 hours) at 06/30/16 1702 Last data filed at 06/30/16 0272  Gross per 24 hour  Intake             1935 ml  Output                0 ml  Net             1935 ml    PHYSICAL EXAMINATION:  GENERAL:  82 y.o.-year-old patient lying in the bed with no acute distress.  EYES: Pupils equal, round, reactive to light and accommodation. No scleral icterus. Extraocular muscles intact.  HEENT: Head atraumatic, normocephalic. Oropharynx and nasopharynx clear.  NECK:  Supple, no jugular venous distention. No thyroid enlargement, no tenderness.  LUNGS: Normal breath sounds bilaterally, no wheezing, rales,rhonchi or crepitation. No use of accessory muscles of respiration.  CARDIOVASCULAR: S1, S2 normal. No murmurs, rubs, or gallops.  ABDOMEN: Soft, non-tender, non-distended. Bowel sounds present. No organomegaly or mass.  EXTREMITIES: No pedal edema, cyanosis, or clubbing.  NEUROLOGIC: Cranial nerves II through XII are intact. Muscle strength 5/5 in all extremities. Sensation intact. Gait not checked.  PSYCHIATRIC: The patient is alert and oriented x 3.  SKIN: No obvious rash, lesion, or ulcer.   DATA REVIEW:   CBC  Recent Labs Lab 06/27/16 1401  WBC 9.4  HGB 12.5  HCT 36.8  PLT 282    Chemistries   Recent Labs Lab  06/27/16 1401 06/28/16 0557  NA 139 137  K 3.8 3.5  CL 104 103  CO2 29 26  GLUCOSE 91 128*  BUN 15 13  CREATININE 0.84 0.63  CALCIUM 9.6 9.3  AST 23  --   ALT 12*  --   ALKPHOS 65  --   BILITOT 0.9  --     Cardiac Enzymes  Recent Labs Lab 06/30/16 1138  TROPONINI 0.04*    Microbiology Results  Results for orders placed or performed during the hospital encounter of 06/27/16  Urine culture     Status: None   Collection Time: 06/27/16  2:01 PM  Result Value Ref Range Status   Specimen Description URINE, CLEAN CATCH  Final   Special Requests NONE  Final   Culture NO GROWTH Performed at [facility]   Final   Report Status 06/28/2016 FINAL  Final  Culture, blood (routine x 2)     Status: None (Preliminary result)   Collection Time: 06/27/16  2:01 PM  Result Value Ref Range Status   Specimen Description BLOOD LEFT WRIST  Final   Special Requests   Final  BOTTLES DRAWN AEROBIC AND ANAEROBIC  ANA 16CC AER 11CC   Culture NO GROWTH 3 DAYS  Final   Report Status PENDING  Incomplete  Culture, blood (routine x 2)     Status: None (Preliminary result)   Collection Time: 06/27/16  2:06 PM  Result Value Ref Range Status   Specimen Description BLOOD RIGHT ARM  Final   Special Requests   Final    BOTTLES DRAWN AEROBIC AND ANAEROBIC ANA 10CC AER 8CC   Culture NO GROWTH 3 DAYS  Final   Report Status PENDING  Incomplete    RADIOLOGY:  No results found.  EKG:   Orders placed or performed during the hospital encounter of 01/02/16  . EKG 12-Lead  . EKG 12-Lead  . EKG      Management plans discussed with the patient, family and they are in agreement.  CODE STATUS:     Code Status Orders        Start     Ordered   06/27/16 1659  Do not attempt resuscitation (DNR)  Continuous    Question Answer Comment  In the event of cardiac or respiratory ARREST Do not call a "code blue"   In the event of cardiac or respiratory ARREST Do not perform Intubation, CPR,  defibrillation or ACLS   In the event of cardiac or respiratory ARREST Use medication by any route, position, wound care, and other measures to relive pain and suffering. May use oxygen, suction and manual treatment of airway obstruction as needed for comfort.      06/27/16 1700    Code Status History    Date Active Date Inactive Code Status Order ID Comments User Context   04/24/2016  3:29 PM 04/27/2016  7:12 PM Full Code [id number]  Auburn Bilberry, MD Inpatient   01/02/2016  8:03 PM 01/04/2016  5:18 PM Full Code [id number]  Houston Siren, MD Inpatient    Advance Directive Documentation   Flowsheet Row Most Recent Value  Type of Advance Directive  Out of facility DNR (pink MOST or yellow form)  Pre-existing out of facility DNR order (yellow form or pink MOST form)  No data  "MOST" Form in Place?  No data      TOTAL TIME TAKING CARE OF THIS PATIENT: 40 minutes.    Katharina Caper M.D on 06/30/2016 at 5:02 PM  Between 7am to 6pm - Pager - [phone]  After 6pm go to [email or web address] - password [email or web address] [facility]  [facility]  Office  [phone]  CC: Primary care physician; Tommie Sams, DO

## 2016-06-30 NOTE — Care Management (Signed)
Patient to discharge home today.  NO PT follow up recommendation.  Patient lives at home with daughter.  Daughter states that someone is with her at all times  [facility].  Palliative has spoken with daughter via phone, it was relayed to me by bedside nurse that family declined in home hospice services as daughter stated "she worked for hospice".  No RNCM needs identified.

## 2016-06-30 NOTE — Care Management Important Message (Signed)
Important Message  Patient Details  Name: Julia Huffman MRN: [id number] Date of Birth: [birth year]   Medicare Important Message Given:  Yes    Chapman FitchBOWEN, Erian Rosengren T, RN 06/30/2016, 5:00 PM

## 2016-06-30 NOTE — Progress Notes (Addendum)
No charge note  Reviewed the chart.  Spoke with daughter Julia Eng(Terri) who referred me to daughter Julia Huffman who is the patient's primary care taker.  I attempted to call [name] 2x.  I examined the patient and found a very frail female who was confused and was SOB with any conversation.  Unfortunately I was unable to speak with [name] so I was unable to conduct a GOC conversation.    I do believe Julia Huffman is hospice eligible based on her frailty, weight loss, dementia, and most of all her end stage pulmonary fibrosis with dyspnea at rest.  Based on Dr. Eddie NorthSudini's note of 06/27/2016 - These goals were established:  DNR/DNI, No PEG, No aggressive measures.  Comfort is a priority.  I left [name] my cell phone number on her voice mail to call if I could be of service.  Algis DownsMarianne York, [place] JerseyPA-C Palliative Medicine Pager: [phone]

## 2016-07-01 DIAGNOSIS — Z7189 Other specified counseling: Secondary | ICD-10-CM

## 2016-07-01 DIAGNOSIS — Z515 Encounter for palliative care: Secondary | ICD-10-CM

## 2016-07-01 LAB — LEVETIRACETAM LEVEL
Levetiracetam Lvl: 13.1 ug/mL (ref 10.0–40.0)
Levetiracetam Lvl: 16.6 ug/mL (ref 10.0–40.0)

## 2016-07-01 NOTE — Consult Note (Signed)
Consultation Note Date: 06/30/2016   Patient Name: Julia Huffman  DOB: 05-18-1935  MRN: [id number]  Age / Sex: 82 y.o., female  PCP: Tommie Sams, DO Referring Physician: No att. providers found  Reason for Consultation: Establishing goals of care  HPI/Patient Profile: 82 y.o. female  with past medical history of end stage pulmonary fibrosis, CKD, dementia, and seizures who was admitted on 06/27/2016 with an unresponsive episode, weight loss and left facial droop.  She underwent an extensive work up for the unresponsive episode including MRI (results were reviewed and found to be unremarkable for acute changes), her urine culture showed no infection and blood cultures show no growth to day.  She was treated with IV fluids and conservative treatment and returned to her baseline. This is Mr.s Luff 3 admission in the last 6 months.  PMT was consulted for goal of care.  Clinical Assessment and Goals of Care: I reviewed the chart.  Spoke with daughter Julia Huffman) who referred me to daughter Julia Huffman who is the patient's primary care taker.  Per Terri the patient lives at home with [name].   I attempted to call [name] 2x.  I examined the patient and found a very frail female who was confused and was SOB at rest.  After assessing Mrs. Seifert I realized she is hospice eligible based on her frailty, weight loss, dementia, and most of all her end stage pulmonary fibrosis with dyspnea at rest.  Based on Dr. Eddie North note of 06/27/2016 - These goals were established:  DNR/DNI, No PEG, No aggressive measures. Comfort is a priority.  Mrs. Denny Peon returned my call later in the evening.  She is a Psychiatric nurse for [employer].  She understands that her mother is eligible for Hospice and is able to engage those services herself when the family is ready.  She confirmed the goals of DNR, No PEG,  and no aggressive measures.  She is trying to engage [facility] to develop more services for her mother in her home.    Primary Decision Maker:  NEXT OF KIN, Julia Huffman, Dtr.    SUMMARY OF RECOMMENDATIONS    DNR No PEG No aggressive measures No SNF.  Patient will be cared for in her dtr's home. Dtr works with Hospice and will engage those services when she is ready.   Psycho-social/Spiritual:   Desire for further Chaplaincy support:yes  Additional Recommendations: Caregiving  Support/Resources  Prognosis:   < 6 months based on endstage pulmonary fibrosis, minimal PO intake, recurrent hospitalizations.  Discharge Planning: Home with Home Health.  Dtr is applying to [facility]      Primary Diagnoses: Present on Admission: . Acute encephalopathy   I have reviewed the medical record, interviewed the patient and family, and examined the patient. The following aspects are pertinent.  Past Medical History:  Diagnosis Date  . Arthritis   . Chicken pox   . Chronic kidney disease   . History of vasculitis    History  of Microscopic polyangitis  . Hypertension   . Osteoporosis   . Pulmonary fibrosis [facility](HCC)    Social History   Social History  . Marital status: Widowed    Spouse name: N/A  . Number of children: N/A  . Years of education: N/A   Social History Main Topics  . Smoking status: Former Smoker    Packs/day: 0.25    Years: 5.00    Types: Cigarettes    Quit date: 01/22/1991  . Smokeless tobacco: Never Used  . Alcohol use No  . Drug use: No  . Sexual activity: Not Asked   Other Topics Concern  . None   Social History Narrative  . None   Family History  Problem Relation Age of Onset  . Heart attack Mother   . Arthritis Mother   . Heart disease Mother   . Hypertension Mother    Scheduled Meds: Continuous Infusions: PRN Meds:. No Known Allergies Review of Systems patient confused and SOB  Physical Exam  Constitutional:    Frail, elderly, NAD  HENT:  Head: Normocephalic and atraumatic.  Eyes: EOM are normal. No scleral icterus.  Neck: Neck supple. No thyromegaly present.  Cardiovascular: Exam reveals gallop.   Tachycardic, irregular with murmur.  Pulmonary/Chest: She has no wheezes. She has no rales.  Mildly sob at rest.  Abdominal: Soft. Bowel sounds are normal. She exhibits no distension. There is no tenderness.  Musculoskeletal: She exhibits no edema or deformity.  Neurological: She is alert.  Confused.  Very difficult to understand.  Attempts to follow commands.  Skin: Skin is warm and dry.  Psychiatric: Her behavior is normal.  Pleasantly demented.  Cooperative.  Nursing note and vitals reviewed.    Vital Signs: BP (!) 147/81   Pulse 76   Temp 97.8 F (36.6 C) (Oral)   Resp (!) 28   Ht 5\' 2"  (1.575 m)   Wt 40.6 kg (89 lb 8 oz)   SpO2 91%   BMI 16.37 kg/m  Pain Assessment: PAINAD   Pain Score: 0-No pain   SpO2: SpO2: 91 % O2 Device:SpO2: 91 % O2 Flow Rate: .O2 Flow Rate (L/min): 1 L/min  IO: Intake/output summary:   Intake/Output Summary (Last 24 hours) at 07/01/16 0751 Last data filed at 06/30/16 [id number]  Gross per 24 hour  Intake              780 ml  Output                0 ml  Net              780 ml    LBM: Last BM Date:  (unable to assess, patient unsure) Baseline Weight: Weight: 38.6 kg (85 lb) Most recent weight: Weight: 40.6 kg (89 lb 8 oz)     Palliative Assessment/Data:   Flowsheet Rows   Flowsheet Row Most Recent Value  Intake Tab  Referral Department  Hospitalist  Unit at Time of Referral  Med/Surg Unit  Palliative Care Primary Diagnosis  Pulmonary  Date Notified  07/29/16  Palliative Care Type  New Palliative care  Reason for referral  Clarify Goals of Care  Date of Admission  06/27/16  Date first seen by Palliative Care  06/30/16  # of days Palliative referral response time  -29 Day(s)  # of days IP prior to Palliative referral  32  Clinical Assessment   Palliative Performance Scale Score  20%  Dyspnea Max Last 24 Hours  3  Dyspnea  Min Last 24 hours  3  Psychosocial & Spiritual Assessment  Palliative Care Outcomes  Patient/Family meeting held?  Yes  Who was at the meeting?  patient and dtr on the phone  Palliative Care Outcomes  Clarified goals of care      Time In: 10:00 Time Out: 10:35 Time Total: 35 min. Greater than 50%  of this time was spent counseling and coordinating care related to the above assessment and plan.  Signed by: Algis Downs, PA-C Palliative Medicine Pager: [phone]  Please contact Palliative Medicine Team phone at [phone] for questions and concerns.  For individual provider: See Loretha Stapler

## 2016-07-02 ENCOUNTER — Ambulatory Visit (INDEPENDENT_AMBULATORY_CARE_PROVIDER_SITE_OTHER): Payer: Medicare Other | Admitting: Cardiology

## 2016-07-02 ENCOUNTER — Encounter: Payer: Self-pay | Admitting: Cardiology

## 2016-07-02 VITALS — BP 132/88 | HR 99 | Ht 65.0 in | Wt 96.5 lb

## 2016-07-02 DIAGNOSIS — I10 Essential (primary) hypertension: Secondary | ICD-10-CM | POA: Diagnosis not present

## 2016-07-02 DIAGNOSIS — R9431 Abnormal electrocardiogram [ECG] [EKG]: Secondary | ICD-10-CM

## 2016-07-02 LAB — CULTURE, BLOOD (ROUTINE X 2)
CULTURE: NO GROWTH
Culture: NO GROWTH

## 2016-07-02 NOTE — Telephone Encounter (Signed)
Tried for 2 days to reach patient daughter by phone left messages to return call to office.

## 2016-07-02 NOTE — Telephone Encounter (Signed)
left third message for patient and DPR to call office.

## 2016-07-02 NOTE — Patient Instructions (Signed)
Follow-Up: Your physician recommends that you schedule a follow-up appointment as needed with Dr. Ingal.   It was a pleasure seeing you today here in the office. Please do not hesitate to give us a call back if you have any further questions. [phone]  Mylea Roarty A. RN, BSN     

## 2016-07-02 NOTE — Progress Notes (Signed)
Cardiology Office Note   Date:  07/02/2016   ID:  Julia Huffman, DOB 29-Jul-1934, MRN [id number]  Referring Doctor:  Tommie Sams, DO   Cardiologist:   Almond Lint, MD   Reason for consultation:  Chief Complaint  Patient presents with  . other    F/u ED c/o elevated BP. Meds reviewed verbally with pt.      History of Present Illness: Julia Huffman is a 82 y.o. female who presents for evaluation for hypertension  Review of records show: "82 y.o. female  with past medical history of end stage pulmonary fibrosis, CKD, dementia, and seizures admitted on 06/27/2016 with an unresponsive episode, weight loss and left facial droop.  She underwent an extensive work up for the unresponsive episode including MRI (results were reviewed and found to be unremarkable for acute changes), her urine culture showed no infection and blood cultures show no growth to day.  She was treated with IV fluids and conservative treatment and returned to her baseline."  As per daughter, her blood pressure has somewhat improved since discharge. They report that a nurse comes to visit at least 4 times a week and they can do BP log. Patient is unreliable historian due to baseline mental state from dementia.She does not volunteer any symptoms of chest pain, shortness of breath. Per daughter, appetite has been poor. She likely only drinks 10 ounces of fluids a day. No reports of complaints of palpitations, no further episodes of unresponsiveness or loss of consciousness. No complaints of lightheadedness or dizziness.  ROS:  Please see the history of present illness. Unable to complete this in detail due to patient's mental state secondary to dementia   Past Medical History:  Diagnosis Date  . Arthritis   . Chicken pox   . Chronic kidney disease    Stage III  . History of vasculitis    History of Microscopic polyangitis  . Hypertension   . Osteoporosis   . Pulmonary fibrosis (HCC)     Past Surgical  History:  Procedure Laterality Date  . BREAST SURGERY    . TOTAL HIP ARTHROPLASTY       reports that she quit smoking about 25 years ago. Her smoking use included Cigarettes. She has a 1.25 pack-year smoking history. She has never used smokeless tobacco. She reports that she does not drink alcohol or use drugs.   family history includes Arthritis in her mother; Heart attack in her mother; Heart disease in her mother; Hypertension in her mother.   Outpatient Medications Prior to Visit  Medication Sig Dispense Refill  . acetaminophen (TYLENOL) 500 MG tablet Take 500 mg by mouth every 6 (six) hours as needed.    Marland Kitchen aspirin EC 81 MG tablet Take 81 mg by mouth daily.    . feeding supplement, ENSURE ENLIVE, (ENSURE ENLIVE) LIQD Take 237 mLs by mouth 2 (two) times daily between meals. 237 mL 12  . levETIRAcetam (KEPPRA) 750 MG tablet Take 1 tablet (750 mg total) by mouth 2 (two) times daily. 60 tablet 5  . mirtazapine (REMERON) 15 MG tablet Take 15 mg by mouth at bedtime.    . Multiple Vitamin (MULTIVITAMIN WITH MINERALS) TABS tablet Take 1 tablet by mouth daily. 30 tablet 5   No facility-administered medications prior to visit.      Allergies: Patient has no known allergies.    PHYSICAL EXAM: VS:  BP 132/88 (BP Location: Right Arm, Patient Position: Sitting, Cuff Size: Normal)   Pulse 99  Ht 5\' 5"  (1.651 m)   Wt 96 lb 8 oz (43.8 kg)   BMI 16.06 kg/m  , Body mass index is 16.06 kg/m. Wt Readings from Last 3 Encounters:  07/02/16 96 lb 8 oz (43.8 kg)  06/30/16 89 lb 8 oz (40.6 kg)  05/27/16 97 lb (44 kg)    GENERAL:  well developed, well nourished,thin-appearing, frail looking, not in acute distress HEENT: normocephalic, pink conjunctivae, anicteric sclerae, no xanthelasma, normal dentition, oropharynx clear NECK:  no neck vein engorgement, JVP normal, no hepatojugular reflux, carotid upstroke brisk and symmetric, no bruit, no thyromegaly, no lymphadenopathy LUNGS:  good respiratory  effort, clear to auscultation bilaterally CV:  PMI not displaced, no thrills, no lifts, S1 and S2 within normal limits, no palpable S3 or S4, no murmurs, no rubs, no gallops ABD:  Soft, nontender, nondistended, normoactive bowel sounds, no abdominal aortic bruit, no hepatomegaly, no splenomegaly MS: nontender back, kyphosis, no scoliosis, no joint deformities EXT:  2+ DP/PT pulses, no edema, no varicosities, no cyanosis, no clubbing SKIN: warm, nondiaphoretic, normal turgor, no ulcers NEUROPSYCH: alert,  sensory/motor grossly intact, flat affect  Recent Labs: 01/02/2016: B Natriuretic Peptide 126.0; Magnesium 1.6 06/27/2016: ALT 12; Hemoglobin 12.5; Platelets 282; TSH 1.328 06/28/2016: BUN 13; Creatinine, Ser 0.63; Potassium 3.5; Sodium 137   Lipid Panel No results found for: CHOL, TRIG, HDL, CHOLHDL, VLDL, LDLCALC, LDLDIRECT   Other studies Reviewed:  EKG:  The ekg from 07/02/2016 was personally reviewed by me and it revealed sinus rhythm, 99 BPM. Right atrial enlargements. Left axis deviation. Poor R-wave progression.  Additional studies/ records that were reviewed personally reviewed by me today include: none available   ASSESSMENT AND PLAN: Consult for hypertension Currently,  Blood pressure improved. Due to patient's overall frailty, poor by mouth intake, would be hesitant to start on a daily antihypertensive regimen. Recommend blood pressure monitoring. If blood pressure is persistently greater than 150/90, may consider low dose antihypertensive medication. May do trial with amlodipine 2.5 mg daily or when necessary. May also do lisinopril 5 mg by mouth daily or when necessary.  Abnormal EKG Tachycardia Tachycardia is likely compensatory response to patient's pulmonary status, end-stage pulmonary fibrosis. Would not treat the tachycardia with medication at this point. May consider echocardiogram. Family will think about this first. Patient is a poor candidate for any invasive  therapy.   Current medicines are reviewed at length with the patient's family/daughter today.  The patient does not have concerns regarding medicines.  Labs/ tests ordered today include: Orders Placed This Encounter  Procedures  . EKG 12-Lead    I had a lengthy and detailed discussion with the patient's family regarding diagnoses, prognosis, diagnostic options, treatment options, and side effects of medications.     Disposition:   FU with undersigned prn.due to logistics, the family would like to continue following up with PCP instead.  I spent at least 45 minutes with the patient today and more than 50% of the time was spent counseling the patient and coordinating care.    Signed, Almond LintAileen Alesi Zachery, MD  07/02/2016 4:28 PM    [facility]  This note was generated in part with voice recognition software and I apologize for any typographical errors that were not detected and corrected.

## 2016-07-03 NOTE — Telephone Encounter (Signed)
Was not able to TCM due tono answer and no return call. FYI

## 2016-07-06 ENCOUNTER — Ambulatory Visit (INDEPENDENT_AMBULATORY_CARE_PROVIDER_SITE_OTHER): Payer: Medicare Other | Admitting: Family Medicine

## 2016-07-06 ENCOUNTER — Encounter: Payer: Self-pay | Admitting: Family Medicine

## 2016-07-06 DIAGNOSIS — J8410 Pulmonary fibrosis, unspecified: Secondary | ICD-10-CM

## 2016-07-06 DIAGNOSIS — I10 Essential (primary) hypertension: Secondary | ICD-10-CM | POA: Diagnosis not present

## 2016-07-06 DIAGNOSIS — G9340 Encephalopathy, unspecified: Secondary | ICD-10-CM | POA: Diagnosis not present

## 2016-07-06 MED ORDER — AMLODIPINE BESYLATE 2.5 MG PO TABS: 2.5000 mg | ORAL_TABLET | Freq: Every day | ORAL | 0 refills | Status: AC

## 2016-07-06 NOTE — Patient Instructions (Signed)
Consider hospice care.  If BP remains elevated >150/90 start the norvasc.  Take care  Dr. Adriana Simasook

## 2016-07-06 NOTE — Progress Notes (Signed)
Pre visit review using our clinic review tool, if applicable. No additional management support is needed unless otherwise documented below in the visit note. 

## 2016-07-06 NOTE — Assessment & Plan Note (Signed)
Currently resolved. °

## 2016-07-06 NOTE — Assessment & Plan Note (Signed)
BP elevated today but improved with recheck (150/100). Advised to start Norvasc 2.5 mg daily if her blood pressure continues to be elevated greater than 150/90.

## 2016-07-06 NOTE — Progress Notes (Signed)
Subjective:  Patient ID: Julia Huffman, female    DOB: Oct 26, 1934  Age: 82 y.o. MRN: [id number]  CC: Hospital follow up  HPI:  82 year old female with advanced pulmonary fibrosis and chronic respiratory failure and seizure presents for hospital follow-up.  Patient was recently hospitalized from 12/30 to 1/2. She was admitted for unresponsiveness/acute encephalopathy. Workup was unremarkable. Her Keppra was increased. There was no evidence of infection or other acute neurological reason for the change. BP was elevated but no medications were added during admission.  Patient presents today for follow-up. BP elevated today. She is accompanied by her son-in-law. He is concerned about her blood pressure. No other complaints or concerns at this time. Patient is minimally interactive and is asleep during our visit.  Social Hx   Social History   Social History  . Marital status: Widowed    Spouse name: N/A  . Number of children: N/A  . Years of education: N/A   Social History Main Topics  . Smoking status: Former Smoker    Packs/day: 0.25    Years: 5.00    Types: Cigarettes    Quit date: 01/22/1991  . Smokeless tobacco: Never Used  . Alcohol use No  . Drug use: No  . Sexual activity: Not Asked   Other Topics Concern  . None   Social History Narrative  . None   Review of Systems  Respiratory: Positive for shortness of breath.   Cardiovascular: Positive for leg swelling.  Psychiatric/Behavioral: Positive for confusion.   Objective:  BP (!) 170/115   Pulse (!) 104   Resp 16   Wt 98 lb 12.8 oz (44.8 kg)   SpO2 100%   BMI 16.44 kg/m   BP/Weight 07/06/2016 07/02/2016 06/30/2016  Systolic BP 170 132 147  Diastolic BP 115 88 81  Wt. (Lbs) 98.8 96.5 89.5  BMI 16.44 16.06 16.37   Physical Exam  Constitutional:  Frail, chronically ill-appearing female. Resting comfortably in wheelchair.  Cardiovascular: Regular rhythm.   Tachycardic.  Pulmonary/Chest:  Diffuse crackles.    Neurological:  Alert but minimally interactive. Was sleeping predominantly during our visit.  Vitals reviewed.  Lab Results  Component Value Date   WBC 9.4 06/27/2016   HGB 12.5 06/27/2016   HCT 36.8 06/27/2016   PLT 282 06/27/2016   GLUCOSE 128 (H) 06/28/2016   ALT 12 (L) 06/27/2016   AST 23 06/27/2016   NA 137 06/28/2016   K 3.5 06/28/2016   CL 103 06/28/2016   CREATININE 0.63 06/28/2016   BUN 13 06/28/2016   CO2 26 06/28/2016   TSH 1.328 06/27/2016   HGBA1C 5.3 04/24/2016   Assessment & Plan:   Problem List Items Addressed This Visit    Pulmonary fibrosis (HCC)    Patient has advanced fibrosis. She has had several admissions. She continues to decline. I urged family to consider hospice care.      Essential hypertension    BP elevated today but improved with recheck (150/100). Advised to start Norvasc 2.5 mg daily if her blood pressure continues to be elevated greater than 150/90.      Relevant Medications   amLODipine (NORVASC) 2.5 MG tablet   Acute encephalopathy    Currently resolved.         Meds ordered this encounter  Medications  . amLODipine (NORVASC) 2.5 MG tablet    Sig: Take 1 tablet (2.5 mg total) by mouth daily.    Dispense:  90 tablet    Refill:  0  Follow-up: PRN  [place]

## 2016-07-06 NOTE — Assessment & Plan Note (Signed)
Patient has advanced fibrosis. She has had several admissions. She continues to decline. I urged family to consider hospice care.

## 2016-07-07 ENCOUNTER — Ambulatory Visit: Payer: Medicare Other | Admitting: Internal Medicine

## 2016-07-14 ENCOUNTER — Telehealth: Payer: Self-pay | Admitting: Family Medicine

## 2016-07-14 NOTE — Telephone Encounter (Signed)
Need order faxed over for blood pressure monitor and parameters [phone] fax

## 2016-07-14 NOTE — Telephone Encounter (Addendum)
[facility] requesting order for Blood pressure monitor and parameters fax [phone].  Please advise and I can draft have you sign.  Thanks.

## 2016-07-15 NOTE — Telephone Encounter (Signed)
Ok with me. Draft letter with parameters - If persistently elevanewted >150/90 notify PCP.

## 2016-07-17 NOTE — Telephone Encounter (Signed)
Letter printed and faxed to [facility].

## 2016-10-06 ENCOUNTER — Emergency Department: Payer: Medicare (Managed Care)

## 2016-10-06 ENCOUNTER — Inpatient Hospital Stay
Admission: EM | Admit: 2016-10-06 | Discharge: 2016-10-07 | DRG: 871 | Disposition: A | Discharge status: Home / Self Care | Payer: Medicare (Managed Care) | Attending: Internal Medicine | Admitting: Internal Medicine

## 2016-10-06 ENCOUNTER — Encounter: Payer: Self-pay | Admitting: Emergency Medicine

## 2016-10-06 DIAGNOSIS — J189 Pneumonia, unspecified organism: Secondary | ICD-10-CM | POA: Diagnosis present

## 2016-10-06 DIAGNOSIS — Z66 Do not resuscitate: Secondary | ICD-10-CM | POA: Diagnosis present

## 2016-10-06 DIAGNOSIS — Z96641 Presence of right artificial hip joint: Secondary | ICD-10-CM | POA: Diagnosis present

## 2016-10-06 DIAGNOSIS — J8410 Pulmonary fibrosis, unspecified: Secondary | ICD-10-CM

## 2016-10-06 DIAGNOSIS — J9610 Chronic respiratory failure, unspecified whether with hypoxia or hypercapnia: Secondary | ICD-10-CM | POA: Diagnosis present

## 2016-10-06 DIAGNOSIS — Z87891 Personal history of nicotine dependence: Secondary | ICD-10-CM

## 2016-10-06 DIAGNOSIS — R627 Adult failure to thrive: Secondary | ICD-10-CM | POA: Diagnosis present

## 2016-10-06 DIAGNOSIS — W1830XA Fall on same level, unspecified, initial encounter: Secondary | ICD-10-CM | POA: Diagnosis present

## 2016-10-06 DIAGNOSIS — M1990 Unspecified osteoarthritis, unspecified site: Secondary | ICD-10-CM | POA: Diagnosis present

## 2016-10-06 DIAGNOSIS — R000 Tachycardia, unspecified: Secondary | ICD-10-CM | POA: Diagnosis present

## 2016-10-06 DIAGNOSIS — Z7982 Long term (current) use of aspirin: Secondary | ICD-10-CM | POA: Diagnosis not present

## 2016-10-06 DIAGNOSIS — Z8261 Family history of arthritis: Secondary | ICD-10-CM | POA: Diagnosis not present

## 2016-10-06 DIAGNOSIS — Z9889 Other specified postprocedural states: Secondary | ICD-10-CM | POA: Diagnosis not present

## 2016-10-06 DIAGNOSIS — M810 Age-related osteoporosis without current pathological fracture: Secondary | ICD-10-CM | POA: Diagnosis present

## 2016-10-06 DIAGNOSIS — F0390 Unspecified dementia without behavioral disturbance: Secondary | ICD-10-CM | POA: Diagnosis present

## 2016-10-06 DIAGNOSIS — I129 Hypertensive chronic kidney disease with stage 1 through stage 4 chronic kidney disease, or unspecified chronic kidney disease: Secondary | ICD-10-CM | POA: Diagnosis present

## 2016-10-06 DIAGNOSIS — Z681 Body mass index (BMI) 19 or less, adult: Secondary | ICD-10-CM | POA: Diagnosis not present

## 2016-10-06 DIAGNOSIS — A419 Sepsis, unspecified organism: Secondary | ICD-10-CM | POA: Diagnosis not present

## 2016-10-06 DIAGNOSIS — I10 Essential (primary) hypertension: Secondary | ICD-10-CM | POA: Diagnosis present

## 2016-10-06 DIAGNOSIS — J181 Lobar pneumonia, unspecified organism: Secondary | ICD-10-CM

## 2016-10-06 DIAGNOSIS — Z8249 Family history of ischemic heart disease and other diseases of the circulatory system: Secondary | ICD-10-CM | POA: Diagnosis not present

## 2016-10-06 DIAGNOSIS — G40909 Epilepsy, unspecified, not intractable, without status epilepticus: Secondary | ICD-10-CM | POA: Diagnosis present

## 2016-10-06 DIAGNOSIS — S72111A Displaced fracture of greater trochanter of right femur, initial encounter for closed fracture: Secondary | ICD-10-CM

## 2016-10-06 DIAGNOSIS — Z79899 Other long term (current) drug therapy: Secondary | ICD-10-CM

## 2016-10-06 DIAGNOSIS — R64 Cachexia: Secondary | ICD-10-CM | POA: Diagnosis present

## 2016-10-06 DIAGNOSIS — N183 Chronic kidney disease, stage 3 (moderate): Secondary | ICD-10-CM | POA: Diagnosis present

## 2016-10-06 DIAGNOSIS — Z515 Encounter for palliative care: Secondary | ICD-10-CM | POA: Diagnosis not present

## 2016-10-06 LAB — CBC WITH DIFFERENTIAL/PLATELET
BASOS ABS: 0 10*3/uL (ref 0–0.1)
BASOS PCT: 0 %
EOS ABS: 0 10*3/uL (ref 0–0.7)
EOS PCT: 0 %
HCT: 36.2 % (ref 35.0–47.0)
Hemoglobin: 11.7 g/dL — ABNORMAL LOW (ref 12.0–16.0)
Lymphocytes Relative: 13 %
Lymphs Abs: 1.4 10*3/uL (ref 1.0–3.6)
MCH: 26 pg (ref 26.0–34.0)
MCHC: 32.3 g/dL (ref 32.0–36.0)
MCV: 80.4 fL (ref 80.0–100.0)
MONO ABS: 0.4 10*3/uL (ref 0.2–0.9)
Monocytes Relative: 4 %
Neutro Abs: 8.4 10*3/uL — ABNORMAL HIGH (ref 1.4–6.5)
Neutrophils Relative %: 83 %
PLATELETS: 281 10*3/uL (ref 150–440)
RBC: 4.51 MIL/uL (ref 3.80–5.20)
RDW: 14.5 % (ref 11.5–14.5)
WBC: 10.2 10*3/uL (ref 3.6–11.0)

## 2016-10-06 LAB — URINALYSIS, COMPLETE (UACMP) WITH MICROSCOPIC
BILIRUBIN URINE: NEGATIVE
Bacteria, UA: NONE SEEN
Glucose, UA: NEGATIVE mg/dL
HGB URINE DIPSTICK: NEGATIVE
Ketones, ur: NEGATIVE mg/dL
LEUKOCYTES UA: NEGATIVE
NITRITE: NEGATIVE
PH: 8 (ref 5.0–8.0)
Protein, ur: 100 mg/dL — AB
SPECIFIC GRAVITY, URINE: 1.017 (ref 1.005–1.030)
Squamous Epithelial / LPF: NONE SEEN

## 2016-10-06 LAB — COMPREHENSIVE METABOLIC PANEL
ALT: 16 U/L (ref 14–54)
AST: 31 U/L (ref 15–41)
Albumin: 3.6 g/dL (ref 3.5–5.0)
Alkaline Phosphatase: 69 U/L (ref 38–126)
Anion gap: 7 (ref 5–15)
BILIRUBIN TOTAL: 0.5 mg/dL (ref 0.3–1.2)
BUN: 19 mg/dL (ref 6–20)
CHLORIDE: 102 mmol/L (ref 101–111)
CO2: 29 mmol/L (ref 22–32)
CREATININE: 0.79 mg/dL (ref 0.44–1.00)
Calcium: 9.8 mg/dL (ref 8.9–10.3)
Glucose, Bld: 103 mg/dL — ABNORMAL HIGH (ref 65–99)
Potassium: 3.9 mmol/L (ref 3.5–5.1)
Sodium: 138 mmol/L (ref 135–145)
TOTAL PROTEIN: 8.6 g/dL — AB (ref 6.5–8.1)

## 2016-10-06 LAB — INFLUENZA PANEL BY PCR (TYPE A & B)
INFLBPCR: NEGATIVE
Influenza A By PCR: NEGATIVE

## 2016-10-06 LAB — TROPONIN I: TROPONIN I: 0.03 ng/mL — AB (ref <0.03)

## 2016-10-06 LAB — LACTIC ACID, PLASMA: LACTIC ACID, VENOUS: 1.2 mmol/L (ref 0.5–1.9)

## 2016-10-06 MED ORDER — ENSURE ENLIVE PO LIQD
237.0000 mL | Freq: Two times a day (BID) | ORAL | Status: DC
Start: 2016-10-07 — End: 2016-10-08
  Administered 2016-10-07 (×2): 237 mL via ORAL

## 2016-10-06 MED ORDER — ADULT MULTIVITAMIN W/MINERALS CH
1.0000 | ORAL_TABLET | Freq: Every day | ORAL | Status: DC
  Administered 2016-10-06 – 2016-10-07 (×2): 1 via ORAL
  Filled 2016-10-06 (×2): qty 1

## 2016-10-06 MED ORDER — KETOROLAC TROMETHAMINE 15 MG/ML IJ SOLN: 15.0000 mg | Freq: Four times a day (QID) | INTRAMUSCULAR | Status: DC | PRN

## 2016-10-06 MED ORDER — FENTANYL CITRATE (PF) 100 MCG/2ML IJ SOLN
12.5000 ug | INTRAMUSCULAR | Status: DC | PRN
  Administered 2016-10-06 (×2): 12.5 ug via INTRAVENOUS
  Filled 2016-10-06 (×2): qty 2

## 2016-10-06 MED ORDER — SODIUM CHLORIDE 0.9 % IV SOLN
Freq: Once | INTRAVENOUS | Status: AC
  Administered 2016-10-06: 19:00:00 via INTRAVENOUS

## 2016-10-06 MED ORDER — ACETAMINOPHEN 650 MG RE SUPP
650.0000 mg | Freq: Once | RECTAL | Status: DC
  Filled 2016-10-06: qty 1

## 2016-10-06 MED ORDER — ONDANSETRON HCL 4 MG PO TABS: 4.0000 mg | ORAL_TABLET | Freq: Four times a day (QID) | ORAL | Status: DC | PRN

## 2016-10-06 MED ORDER — ENOXAPARIN SODIUM 40 MG/0.4ML ~~LOC~~ SOLN
40.0000 mg | SUBCUTANEOUS | Status: DC
Start: 2016-10-06 — End: 2016-10-08
  Administered 2016-10-06: 40 mg via SUBCUTANEOUS
  Filled 2016-10-06: qty 0.4

## 2016-10-06 MED ORDER — CEFTRIAXONE SODIUM 1 G IJ SOLR
1.0000 g | INTRAMUSCULAR | Status: DC
  Filled 2016-10-06: qty 10

## 2016-10-06 MED ORDER — SODIUM CHLORIDE 0.9 % IV BOLUS (SEPSIS)
500.0000 mL | Freq: Once | INTRAVENOUS | Status: AC
  Administered 2016-10-06: 500 mL via INTRAVENOUS

## 2016-10-06 MED ORDER — SODIUM CHLORIDE 0.9 % IV BOLUS (SEPSIS)
1000.0000 mL | Freq: Once | INTRAVENOUS | Status: AC
  Administered 2016-10-06: 1000 mL via INTRAVENOUS

## 2016-10-06 MED ORDER — LEVETIRACETAM 750 MG PO TABS
750.0000 mg | ORAL_TABLET | Freq: Two times a day (BID) | ORAL | Status: DC
  Administered 2016-10-06 – 2016-10-07 (×2): 750 mg via ORAL
  Filled 2016-10-06 (×2): qty 1

## 2016-10-06 MED ORDER — MORPHINE SULFATE (PF) 2 MG/ML IV SOLN
1.0000 mg | INTRAVENOUS | Status: DC | PRN
  Administered 2016-10-06 – 2016-10-07 (×2): 1 mg via INTRAVENOUS
  Filled 2016-10-06 (×2): qty 1

## 2016-10-06 MED ORDER — AMLODIPINE BESYLATE 5 MG PO TABS
2.5000 mg | ORAL_TABLET | Freq: Every day | ORAL | Status: DC
  Administered 2016-10-06: 2.5 mg via ORAL
  Filled 2016-10-06 (×2): qty 1

## 2016-10-06 MED ORDER — AZITHROMYCIN 250 MG PO TABS: 250.0000 mg | ORAL_TABLET | Freq: Every day | ORAL | Status: DC

## 2016-10-06 MED ORDER — CEFTRIAXONE SODIUM-DEXTROSE 1-3.74 GM-% IV SOLR
1.0000 g | Freq: Once | INTRAVENOUS | Status: AC
  Administered 2016-10-06: 1 g via INTRAVENOUS
  Filled 2016-10-06: qty 50

## 2016-10-06 MED ORDER — CEFTRIAXONE SODIUM 1 G IJ SOLR: 1.0000 g | Freq: Once | INTRAMUSCULAR | Status: DC

## 2016-10-06 MED ORDER — MIRTAZAPINE 15 MG PO TABS
15.0000 mg | ORAL_TABLET | Freq: Every day | ORAL | Status: DC
  Administered 2016-10-06: 15 mg via ORAL
  Filled 2016-10-06: qty 1

## 2016-10-06 MED ORDER — DOXYCYCLINE HYCLATE 100 MG IV SOLR
100.0000 mg | Freq: Once | INTRAVENOUS | Status: DC
  Filled 2016-10-06: qty 100

## 2016-10-06 MED ORDER — ACETAMINOPHEN 500 MG PO TABS
1000.0000 mg | ORAL_TABLET | Freq: Once | ORAL | Status: DC
  Filled 2016-10-06: qty 2

## 2016-10-06 MED ORDER — ACETAMINOPHEN 325 MG PO TABS: 650.0000 mg | ORAL_TABLET | Freq: Four times a day (QID) | ORAL | Status: DC | PRN

## 2016-10-06 MED ORDER — ASPIRIN EC 81 MG PO TBEC
81.0000 mg | DELAYED_RELEASE_TABLET | Freq: Every day | ORAL | Status: DC
  Administered 2016-10-06 – 2016-10-07 (×2): 81 mg via ORAL
  Filled 2016-10-06 (×2): qty 1

## 2016-10-06 MED ORDER — AZITHROMYCIN 500 MG IV SOLR
500.0000 mg | Freq: Once | INTRAVENOUS | Status: AC
  Administered 2016-10-06: 500 mg via INTRAVENOUS
  Filled 2016-10-06: qty 500

## 2016-10-06 MED ORDER — ACETAMINOPHEN 650 MG RE SUPP: 650.0000 mg | Freq: Four times a day (QID) | RECTAL | Status: DC | PRN

## 2016-10-06 MED ORDER — ONDANSETRON HCL 4 MG/2ML IJ SOLN: 4.0000 mg | Freq: Four times a day (QID) | INTRAMUSCULAR | Status: DC | PRN

## 2016-10-06 NOTE — ED Provider Notes (Signed)
[facility] Emergency Department Provider Note    First MD Initiated Contact with Patient 10/06/16 1508     (approximate)  I have reviewed the triage vital signs and the nursing notes.   HISTORY  Chief Complaint Fall  Level V Caveat:  dementia  HPI Julia Huffman is a 82 y.o. female resents after a ground-level fall that occurred at home in her bathroom last night. Patient has severe dementia and history is obtained from EMS report as well as documentation presented with patient from her home physician Dr. Zoila Shutter.  Patient had a apparently been trying to move without her assistive walking device and fell. Suffered injury to the right ear. Patient was found on a bathroom floor by family member. There was blood on the floor. Was unable to ambulate due to severe pain. Had outpatient x-ray which reportedly showed right hip fracture. The patient is a DO NOT RESUSCITATE with goals of comfort care measures only.   Past Medical History:  Diagnosis Date  . Arthritis   . Chicken pox   . Chronic kidney disease    Stage III  . History of vasculitis    History of Microscopic polyangitis  . Hypertension   . Osteoporosis   . Pulmonary fibrosis (HCC)    Family History  Problem Relation Age of Onset  . Heart attack Mother   . Arthritis Mother   . Heart disease Mother   . Hypertension Mother    Past Surgical History:  Procedure Laterality Date  . BREAST SURGERY    . TOTAL HIP ARTHROPLASTY     Patient Active Problem List   Diagnosis Date Noted  . Essential hypertension 07/06/2016  . Unresponsive episode 06/30/2016  . Acute encephalopathy 06/27/2016  . Seizure (HCC) 04/24/2016  . Headache around the eyes 04/23/2016  . CKD (chronic kidney disease) stage 3, GFR 30-59 ml/min 01/23/2016  . Dysphagia 01/23/2016  . Osteoporosis 01/22/2016  . Pulmonary fibrosis (HCC) 01/15/2016  . Chronic respiratory failure (HCC) 01/15/2016      Prior to Admission  medications   Medication Sig Start Date End Date Taking? Authorizing Provider  acetaminophen (TYLENOL) 500 MG tablet Take 500 mg by mouth every 6 (six) hours as needed.    Historical Provider, MD  amLODipine (NORVASC) 2.5 MG tablet Take 1 tablet (2.5 mg total) by mouth daily. 07/06/16   Tommie Sams, DO  aspirin EC 81 MG tablet Take 81 mg by mouth daily.    Historical Provider, MD  feeding supplement, ENSURE ENLIVE, (ENSURE ENLIVE) LIQD Take 237 mLs by mouth 2 (two) times daily between meals. 07/01/16   Katharina Caper, MD  levETIRAcetam (KEPPRA) 750 MG tablet Take 1 tablet (750 mg total) by mouth 2 (two) times daily. 06/30/16   Katharina Caper, MD  mirtazapine (REMERON) 15 MG tablet Take 15 mg by mouth at bedtime.    Historical Provider, MD  Multiple Vitamin (MULTIVITAMIN WITH MINERALS) TABS tablet Take 1 tablet by mouth daily. 07/01/16   Katharina Caper, MD    Allergies Patient has no known allergies.    Social History Social History  Substance Use Topics  . Smoking status: Former Smoker    Packs/day: 0.25    Years: 5.00    Types: Cigarettes    Quit date: 01/22/1991  . Smokeless tobacco: Never Used  . Alcohol use No    Review of Systems Patient denies headaches, rhinorrhea, blurry vision, numbness, shortness of breath, chest pain, edema, cough, abdominal pain, nausea, vomiting, diarrhea, dysuria,  fevers, rashes or hallucinations unless otherwise stated above in HPI. ____________________________________________   PHYSICAL EXAM:  VITAL SIGNS: Vitals:   10/06/16 1500 10/06/16 1700  BP: (!) 155/93 (!) 184/105  Pulse:    Resp:  (!) 27  Temp:  99 F (37.2 C)    Constitutional: chronically ill appearing,  Laying on left side in fetal position. in no acute distress. Eyes: Conjunctivae are normal. PERRL. EOMI. Head: swelling and contusion to right ear with 1 cm superficial laceration. No hemotympanum Nose: No congestion/rhinnorhea. Mouth/Throat: Mucous membranes are moist.  Oropharynx  non-erythematous. Neck: No stridor. No cervical spine tenderness to palpation Hematological/Lymphatic/Immunilogical: No cervical lymphadenopathy. Cardiovascular: Normal rate, regular rhythm. Grossly normal heart sounds.  Good peripheral circulation. Respiratory: Normal respiratory effort.  No retractions. Lungs with diffuse inspiratory crackles Gastrointestinal: Soft and nontender. No distention. No abdominal bruits. No CVA tenderness. Musculoskeletal: ttp in over right greater trochanter.  Pain with log roll.  Pelvis stable.  No distal deformity Neurologic:  Moans to palpation of right hip and ear.  No facial droop Skin:  Skin is warm, dry and intact. Right ear lac as described above   ____________________________________________   LABS (all labs ordered are listed, but only abnormal results are displayed)  Results for orders placed or performed during the hospital encounter of 10/06/16 (from the past 24 hour(s))  Lactic acid, plasma     Status: None   Collection Time: 10/06/16  3:10 PM  Result Value Ref Range   Lactic Acid, Venous 1.2 0.5 - 1.9 mmol/L  Comprehensive metabolic panel     Status: Abnormal   Collection Time: 10/06/16  3:10 PM  Result Value Ref Range   Sodium 138 135 - 145 mmol/L   Potassium 3.9 3.5 - 5.1 mmol/L   Chloride 102 101 - 111 mmol/L   CO2 29 22 - 32 mmol/L   Glucose, Bld 103 (H) 65 - 99 mg/dL   BUN 19 6 - 20 mg/dL   Creatinine, Ser 1.61 0.44 - 1.00 mg/dL   Calcium 9.8 8.9 - 09.6 mg/dL   Total Protein 8.6 (H) 6.5 - 8.1 g/dL   Albumin 3.6 3.5 - 5.0 g/dL   AST 31 15 - 41 U/L   ALT 16 14 - 54 U/L   Alkaline Phosphatase 69 38 - 126 U/L   Total Bilirubin 0.5 0.3 - 1.2 mg/dL   GFR calc non Af Amer >60 >60 mL/min   GFR calc Af Amer >60 >60 mL/min   Anion gap 7 5 - 15  Troponin I     Status: Abnormal   Collection Time: 10/06/16  3:10 PM  Result Value Ref Range   Troponin I 0.03 (HH) <0.03 ng/mL  CBC WITH DIFFERENTIAL     Status: Abnormal   Collection  Time: 10/06/16  3:10 PM  Result Value Ref Range   WBC 10.2 3.6 - 11.0 K/uL   RBC 4.51 3.80 - 5.20 MIL/uL   Hemoglobin 11.7 (L) 12.0 - 16.0 g/dL   HCT 04.5 40.9 - 81.1 %   MCV 80.4 80.0 - 100.0 fL   MCH 26.0 26.0 - 34.0 pg   MCHC 32.3 32.0 - 36.0 g/dL   RDW 91.4 78.2 - 95.6 %   Platelets 281 150 - 440 K/uL   Neutrophils Relative % 83 %   Neutro Abs 8.4 (H) 1.4 - 6.5 K/uL   Lymphocytes Relative 13 %   Lymphs Abs 1.4 1.0 - 3.6 K/uL   Monocytes Relative 4 %   Monocytes  Absolute 0.4 0.2 - 0.9 K/uL   Eosinophils Relative 0 %   Eosinophils Absolute 0.0 0 - 0.7 K/uL   Basophils Relative 0 %   Basophils Absolute 0.0 0 - 0.1 K/uL  Urinalysis, Complete w Microscopic     Status: Abnormal   Collection Time: 10/06/16  3:42 PM  Result Value Ref Range   Color, Urine YELLOW (A) YELLOW   APPearance CLEAR (A) CLEAR   Specific Gravity, Urine 1.017 1.005 - 1.030   pH 8.0 5.0 - 8.0   Glucose, UA NEGATIVE NEGATIVE mg/dL   Hgb urine dipstick NEGATIVE NEGATIVE   Bilirubin Urine NEGATIVE NEGATIVE   Ketones, ur NEGATIVE NEGATIVE mg/dL   Protein, ur 161 (A) NEGATIVE mg/dL   Nitrite NEGATIVE NEGATIVE   Leukocytes, UA NEGATIVE NEGATIVE   RBC / HPF 0-5 0 - 5 RBC/hpf   WBC, UA 0-5 0 - 5 WBC/hpf   Bacteria, UA NONE SEEN NONE SEEN   Squamous Epithelial / LPF NONE SEEN NONE SEEN   Mucous PRESENT   Influenza panel by PCR (type A & B)     Status: None   Collection Time: 10/06/16  5:07 PM  Result Value Ref Range   Influenza A By PCR NEGATIVE NEGATIVE   Influenza B By PCR NEGATIVE NEGATIVE   ____________________________________________  EKG My review and personal interpretation at Time: 15:25 Indication: tachycardia  Rate: 110  Rhythm: sinus Axis: normal Other: normal intervals, no acute STEMI ____________________________________________  RADIOLOGY  I personally reviewed all radiographic images ordered to evaluate for the above acute complaints and reviewed radiology reports and findings.  These  findings were personally discussed with the patient.  Please see medical record for radiology report.  ____________________________________________   PROCEDURES  Procedure(s) performed:  Procedures    Critical Care performed: yes CRITICAL CARE Performed by: Willy Eddy   Total critical care time: 45 minutes  Critical care time was exclusive of separately billable procedures and treating other patients.  Critical care was necessary to treat or prevent imminent or life-threatening deterioration.  Critical care was time spent personally by me on the following activities: development of treatment plan with patient and/or surrogate as well as nursing, discussions with consultants, evaluation of patient's response to treatment, examination of patient, obtaining history from patient or surrogate, ordering and performing treatments and interventions, ordering and review of laboratory studies, ordering and review of radiographic studies, pulse oximetry and re-evaluation of patient's condition.  ____________________________________________   INITIAL IMPRESSION / ASSESSMENT AND PLAN / ED COURSE  Pertinent labs & imaging results that were available during my care of the patient were reviewed by me and considered in my medical decision making (see chart for details).  DDX: sepsis, uti, pna, fracture, ich  Lindley Stachnik is a 82 y.o. who presents to the ED with fever or tachycardia and fall from standing. Patient is a DO NOT RESUSCITATE. Her home physician actually came to bedside and is noted deterioration in her mental status. Since the fall was noted today. Had outpatient x-ray that showed evidence of right hip fracture but is of limited quality. She is according to the family and Dr. Zoila Shutter, not a candidate for operative fixation. Her goals of care are comfort. The fever and tachycardia are however new and her mental status is a decline from baseline. CT imaging of the head will be  ordered to evaluate for ich.  Will obtain blood work and urine culture data to evaluate for source of infection.  Clinical Course as of  Oct 06 1817  Tue Oct 06, 2016  [id number] Patient with evidence of probable early pneumonia given her fever and tachycardia. No significant hyper hypoxia on supple oxygen. Patient with evidence of acute fracture of the greater trochanter. Discussed results with patient and family and they would not want any sort of operative fixation. Based on her evidence of sepsis do feel patient would benefit from hospitalization for IV antibiotics and hydration. Family agree with this plan.  Have discussed with the patient and available family all diagnostics and treatments performed thus far and all questions were answered to the best of my ability. The patient demonstrates understanding and agreement with plan.   [PR]    Clinical Course User Index [PR] Willy Eddy, MD     ____________________________________________   FINAL CLINICAL IMPRESSION(S) / ED DIAGNOSES  Final diagnoses:  Sepsis, due to unspecified organism [facility])  Community acquired pneumonia of right upper lobe of lung (HCC)  Closed displaced fracture of greater trochanter of right femur, initial encounter (HCC)      NEW MEDICATIONS STARTED DURING THIS VISIT:  New Prescriptions   No medications on file     Note:  This document was prepared using Dragon voice recognition software and may include unintentional dictation errors.    Willy Eddy, MD 10/06/16 Rickey Primus

## 2016-10-06 NOTE — ED Triage Notes (Signed)
Patient [facility] ED [facility]  Patient was evaluated there after a fall this am around 5:30am.  Fall was unwitnessed.  Patient is non-verbal at baseline but moans when anyone moves her right hip.  Patient also has a laceration to her right ear.  Patient also has a fever that was discovered during triage.

## 2016-10-06 NOTE — H&P (Signed)
[facility] [place] at [facility]   PATIENT NAME: Julia Huffman    MR#:  [id number]  DATE OF BIRTH:  12/17/1934  DATE OF ADMISSION:  10/06/2016  PRIMARY CARE PHYSICIAN: Tommie Sams, DO   REQUESTING/REFERRING PHYSICIAN: Dr. Willy Eddy  CHIEF COMPLAINT:   Chief Complaint  Patient presents with  . Fall  Fever/pneumonia  HISTORY OF PRESENT ILLNESS:  Julia Huffman  is a 82 y.o. female with a known history of Dementia, pulmonary fibrosis, hypertension, osteoporosis, chronic kidney disease stage III, osteoarthritis who presents to the hospital from the [facility] program after a fall and noted to have a fever and suspected pneumonia. Patient apparently had a fall trying to get up to go to the bathroom at the pace program, she was complaining of some right hip pain and therefore sent to the ER. In the emergency room patient was noted to have a fever and chest x-ray consistent with pneumonia. Hospitalist services were contacted further treatment and evaluation. Patient herself has advanced dementia and therefore most of the history obtained from the daughters at bedside.  PAST MEDICAL HISTORY:   Past Medical History:  Diagnosis Date  . Arthritis   . Chicken pox   . Chronic kidney disease    Stage III  . History of vasculitis    History of Microscopic polyangitis  . Hypertension   . Osteoporosis   . Pulmonary fibrosis (HCC)     PAST SURGICAL HISTORY:   Past Surgical History:  Procedure Laterality Date  . BREAST SURGERY    . TOTAL HIP ARTHROPLASTY      SOCIAL HISTORY:   Social History  Substance Use Topics  . Smoking status: Former Smoker    Packs/day: 0.25    Years: 5.00    Types: Cigarettes    Quit date: 01/22/1991  . Smokeless tobacco: Never Used  . Alcohol use No    FAMILY HISTORY:   Family History  Problem Relation Age of Onset  . Heart attack Mother   . Arthritis Mother   . Heart disease Mother   . Hypertension Mother     DRUG  ALLERGIES:  No Known Allergies  REVIEW OF SYSTEMS:   Review of Systems  Unable to perform ROS: Dementia    MEDICATIONS AT HOME:   Prior to Admission medications   Medication Sig Start Date End Date Taking? Authorizing Provider  acetaminophen (TYLENOL) 500 MG tablet Take 500 mg by mouth every 6 (six) hours as needed.   Yes Historical Provider, MD  amLODipine (NORVASC) 2.5 MG tablet Take 1 tablet (2.5 mg total) by mouth daily. 07/06/16  Yes Tommie Sams, DO  aspirin EC 81 MG tablet Take 81 mg by mouth daily.   Yes Historical Provider, MD  feeding supplement, ENSURE ENLIVE, (ENSURE ENLIVE) LIQD Take 237 mLs by mouth 2 (two) times daily between meals. 07/01/16  Yes Katharina Caper, MD  levETIRAcetam (KEPPRA) 750 MG tablet Take 1 tablet (750 mg total) by mouth 2 (two) times daily. 06/30/16  Yes Katharina Caper, MD  mirtazapine (REMERON) 15 MG tablet Take 15 mg by mouth at bedtime.   Yes Historical Provider, MD  Multiple Vitamin (MULTIVITAMIN WITH MINERALS) TABS tablet Take 1 tablet by mouth daily. 07/01/16   Katharina Caper, MD      VITAL SIGNS:  Blood pressure (!) 163/97, pulse (!) 108, temperature 99 F (37.2 C), resp. rate (!) 27, weight 46.3 kg (102 lb 1.2 oz), SpO2 99 %.  PHYSICAL EXAMINATION:  Physical Exam  GENERAL:  82 y.o.-year-old cachectic patient lying in bed in no acute distress.  EYES: Pupils equal, round, reactive to light and accommodation. No scleral icterus. Extraocular muscles intact.  HEENT: Head atraumatic, normocephalic. Oropharynx and nasopharynx clear. No oropharyngeal erythema, moist oral mucosa  NECK:  Supple, no jugular venous distention. No thyroid enlargement, no tenderness.  LUNGS: Normal breath sounds bilaterally, no wheezing, diffuse dry crackles b/l due to Pulm. Fibrosis. No use of accessory muscles of respiration.  CARDIOVASCULAR: S1, S2 Tachycardic, No murmurs, rubs, gallops, clicks.  ABDOMEN: Soft, nontender, nondistended. Bowel sounds present. No organomegaly or  mass.  EXTREMITIES: No pedal edema, cyanosis, or clubbing. + 2 pedal & radial pulses b/l.   NEUROLOGIC: Cranial nerves II through XII are intact. No focal Motor or sensory deficits appreciated b/l. Globally weak.  PSYCHIATRIC: The patient is alert and oriented x 1.  SKIN: No obvious rash, lesion, or ulcer.   LABORATORY PANEL:   CBC  Recent Labs Lab 10/06/16 1510  WBC 10.2  HGB 11.7*  HCT 36.2  PLT 281   ------------------------------------------------------------------------------------------------------------------  Chemistries   Recent Labs Lab 10/06/16 1510  NA 138  K 3.9  CL 102  CO2 29  GLUCOSE 103*  BUN 19  CREATININE 0.79  CALCIUM 9.8  AST 31  ALT 16  ALKPHOS 69  BILITOT 0.5   ------------------------------------------------------------------------------------------------------------------  Cardiac Enzymes  Recent Labs Lab 10/06/16 1510  TROPONINI 0.03*   ------------------------------------------------------------------------------------------------------------------  RADIOLOGY:  Ct Head Wo Contrast  Result Date: 10/06/2016 CLINICAL DATA:  Patient fell this morning. Nonverbal at baseline. Laceration to her right year. EXAM: CT HEAD WITHOUT CONTRAST TECHNIQUE: Contiguous axial images were obtained from the base of the skull through the vertex without intravenous contrast. COMPARISON:  None. FINDINGS: BRAIN: There is sulcal and ventricular prominence consistent with superficial and central atrophy. No intraparenchymal hemorrhage, mass effect nor midline shift. Periventricular and subcortical white matter hypodensities consistent with chronic small vessel ischemic disease are identified. No acute large vascular territory infarcts. No abnormal extra-axial fluid collections. Basal cisterns are not effaced and midline. VASCULAR: Moderate calcific atherosclerosis of the carotid siphons. SKULL: No skull fracture. No significant scalp soft tissue swelling.  SINUSES/ORBITS: The mastoid air-cells are clear. The included paranasal sinuses are well-aerated.The included ocular globes and orbital contents are non-suspicious. OTHER: None. IMPRESSION: Chronic small vessel ischemic disease of periventricular white matter. No acute intracranial abnormality. Cerebral atrophy. Electronically Signed   By: Tollie Eth M.D.   On: 10/06/2016 17:43   Dg Chest Port 1 View  Result Date: 10/06/2016 CLINICAL DATA:  Unwitnessed fall.  Pain. EXAM: PORTABLE CHEST 1 VIEW COMPARISON:  06/27/2016 FINDINGS: Chronic cardiomegaly. Background pattern of chronic pulmonary fibrosis. Slight increased bilateral upper lobe density could be seen with mild pneumonia. No dense consolidation or lobar collapse. No effusions. No acute bone finding. Chronic degenerative change of the shoulders. IMPRESSION: Chronic pulmonary fibrosis pattern. Slightly more density in the upper lobes which could represent mild pneumonia. Electronically Signed   By: Paulina Fusi M.D.   On: 10/06/2016 17:57   Dg Hip Unilat W Or Wo Pelvis 2-3 Views Right  Result Date: 10/06/2016 CLINICAL DATA:  83 year old female with un witnessed fall. Initial encounter. EXAM: DG HIP (WITH OR WITHOUT PELVIS) 2-3V RIGHT COMPARISON:  04/25/2016 abdominal films. FINDINGS: Post total right hip replacement. Findings suspicious for fracture of the right greater trochanter region with slight separation of fracture fragment. Foley catheter in place. Degenerative changes lumbar spine. IMPRESSION: Post total right hip replacement. Findings suspicious  for fracture of the right greater trochanter region with slight separation of fracture fragment. Electronically Signed   By: Lacy Duverney M.D.   On: 10/06/2016 18:00     IMPRESSION AND PLAN:   82 year old female with past medical history of dementia, Pulmonary fibrosis, chronic respiratory failure, osteoporosis, chronic kidney disease stage III, osteoarthritis, hypertension who presented to the  hospital due to a fall and also noted to have fever with pneumonia.   1. Sepsis-patient meets criteria given her fever, tachycardia and chest x-ray findings suggestive of pneumonia. -We'll treat with IV ceftriaxone, Zithromax, follow blood, sputum cultures.  2. Status post fall and right hip fracture-given her advanced lung disease and pulmonary fibrosis she is not a good surgical candidate. Family does not want to pursue aggressive intervention. -Continue supportive care with pain control.  3. Essential hypertension-continue Norvasc.  4. Seizures-continue Keppra.  5. Dementia with adult failure to thrive-continue Ensure supplements. -Patient is quite cachectic with dementia here with fall and noted to have a hip fracture with pneumonia. We'll get palliative care consult to discuss goals of care. Patient is already a DO NOT RESUSCITATE.    All the records are reviewed and case discussed with ED provider. Management plans discussed with the patient, family and they are in agreement.  CODE STATUS: DNR  TOTAL TIME TAKING CARE OF THIS PATIENT: 45 minutes.    Houston Siren M.D on 10/06/2016 at 6:52 PM  Between 7am to 6pm - Pager - [phone]  After 6pm go to [email or web address] - password [email or web address] [facility]  [facility]  Office  [phone]  CC: Primary care physician; Tommie Sams, DO

## 2016-10-06 NOTE — ED Notes (Signed)
Admitting MD at bedside.

## 2016-10-07 DIAGNOSIS — A419 Sepsis, unspecified organism: Secondary | ICD-10-CM

## 2016-10-07 DIAGNOSIS — J8410 Pulmonary fibrosis, unspecified: Secondary | ICD-10-CM

## 2016-10-07 DIAGNOSIS — Z515 Encounter for palliative care: Secondary | ICD-10-CM

## 2016-10-07 DIAGNOSIS — S72111A Displaced fracture of greater trochanter of right femur, initial encounter for closed fracture: Secondary | ICD-10-CM

## 2016-10-07 LAB — MRSA PCR SCREENING: MRSA by PCR: POSITIVE — AB

## 2016-10-07 MED ORDER — TRAMADOL HCL 50 MG PO TABS: 50.0000 mg | ORAL_TABLET | Freq: Four times a day (QID) | ORAL | 0 refills | Status: DC | PRN

## 2016-10-07 MED ORDER — LEVOFLOXACIN 25 MG/ML PO SOLN: 750.0000 mg | Freq: Every day | ORAL | 0 refills | Status: DC

## 2016-10-07 MED ORDER — ACETAMINOPHEN 160 MG/5ML PO SOLN
650.0000 mg | Freq: Three times a day (TID) | ORAL | Status: DC
  Administered 2016-10-07: 650 mg via ORAL
  Filled 2016-10-07 (×4): qty 20.3

## 2016-10-07 MED ORDER — LEVOFLOXACIN 750 MG PO TABS: 750.0000 mg | ORAL_TABLET | Freq: Every day | ORAL | 0 refills | Status: AC

## 2016-10-07 MED ORDER — CHLORHEXIDINE GLUCONATE CLOTH 2 % EX PADS
6.0000 | MEDICATED_PAD | Freq: Every day | CUTANEOUS | Status: DC
  Administered 2016-10-07: 6 via TOPICAL

## 2016-10-07 MED ORDER — MORPHINE SULFATE (CONCENTRATE) 10 MG/0.5ML PO SOLN: 5.0000 mg | ORAL | Status: DC | PRN

## 2016-10-07 MED ORDER — MUPIROCIN 2 % EX OINT
1.0000 | TOPICAL_OINTMENT | Freq: Two times a day (BID) | CUTANEOUS | Status: DC
Start: 2016-10-07 — End: 2016-10-08
  Administered 2016-10-07 (×2): 1 via NASAL
  Filled 2016-10-07: qty 22

## 2016-10-07 NOTE — Discharge Summary (Signed)
[facility] - [place] at [facility]   PATIENT NAME: Julia Huffman    MR#:  [id number]  DATE OF BIRTH:  [birth year]  DATE OF ADMISSION:  10/06/2016 ADMITTING PHYSICIAN: Houston Siren, MD  DATE OF DISCHARGE: 10/07/2016  PRIMARY CARE PHYSICIAN: Tommie Sams, DO    ADMISSION DIAGNOSIS:  Closed displaced fracture of greater trochanter of right femur, initial encounter (HCC) [S72.111A] Sepsis, due to unspecified organism (HCC) [A41.9] Community acquired pneumonia of right upper lobe of lung (HCC) [J18.1]  DISCHARGE DIAGNOSIS:  Active Problems:   Pneumonia   SECONDARY DIAGNOSIS:   Past Medical History:  Diagnosis Date  . Arthritis   . Chicken pox   . Chronic kidney disease    Stage III  . History of vasculitis    History of Microscopic polyangitis  . Hypertension   . Osteoporosis   . Pulmonary fibrosis [facility])     HOSPITAL COURSE:   82 year old female with past medical history of dementia, Pulmonaryfibrosis, chronic respiratory failure, osteoporosis, chronic kidney disease stage III, osteoarthritis, hypertension who presented to the hospital due to a fall and also noted to have fever with pneumonia.   1. Sepsis: patient meets criteria given her fever, tachycardia due to PNA. She will be discharged with Levaquin.  2. Right hip fracture after mechanical fall: Family does not want to pursue aggressive intervention. Continue supportive care with pain control. Spoke with patient's PCP. Patient is part of the pace program. She will have hospice services through the pace program. She will not require narcotics at discharge for pain. They will assume care once she is discharged.  3. Essential hypertension-blood pressure controlled  Continue Norvasc.  4. History of seizure disorder: continue Keppra.  5. Dementia with adult failure to thrive:  Continue feeding supplement Continue Remeron     DISCHARGE CONDITIONS AND DIET:   stalbe regular  CONSULTS OBTAINED:    DRUG ALLERGIES:  No Known Allergies  DISCHARGE MEDICATIONS:   Current Discharge Medication List    START taking these medications   Details  levofloxacin (LEVAQUIN) 25 MG/ML solution Take 30 mLs (750 mg total) by mouth daily. Qty: 75 mL, Refills: 0      CONTINUE these medications which have NOT CHANGED   Details  acetaminophen (TYLENOL) 500 MG tablet Take 500 mg by mouth every 6 (six) hours as needed.    amLODipine (NORVASC) 2.5 MG tablet Take 1 tablet (2.5 mg total) by mouth daily. Qty: 90 tablet, Refills: 0    aspirin EC 81 MG tablet Take 81 mg by mouth daily.    feeding supplement, ENSURE ENLIVE, (ENSURE ENLIVE) LIQD Take 237 mLs by mouth 2 (two) times daily between meals. Qty: 237 mL, Refills: 12    levETIRAcetam (KEPPRA) 750 MG tablet Take 1 tablet (750 mg total) by mouth 2 (two) times daily. Qty: 60 tablet, Refills: 5    mirtazapine (REMERON) 15 MG tablet Take 15 mg by mouth at bedtime.    Multiple Vitamin (MULTIVITAMIN WITH MINERALS) TABS tablet Take 1 tablet by mouth daily. Qty: 30 tablet, Refills: 5          Today   CHIEF COMPLAINT:  Pain controlled patient mumbling  VITAL SIGNS:  Blood pressure 140/86, pulse (!) 102, temperature 98.9 F (37.2 C), temperature source Oral, resp. rate 18, height  (1.575 m), weight 43.6 kg (96 lb 1.6 oz), SpO2 100 %.   REVIEW OF SYSTEMS:  Review of Systems  Unable to perform ROS: Mental acuity  dementia  PHYSICAL EXAMINATION:  Constitutional: No distress.  Frail thin  HENT:  Head: Normocephalic.  Eyes: No scleral icterus.  Neck: Neck supple. No JVD present. No tracheal deviation present.  Cardiovascular: Normal rate, regular rhythm and normal heart sounds.  Exam reveals no gallop and no friction rub.   No murmur heard. Pulmonary/Chest: Effort normal and breath sounds normal. No respiratory distress. She has no wheezes. She has no rales. She exhibits no tenderness.   Abdominal: Soft. Bowel sounds are normal. She exhibits no distension and no mass. There is no tenderness. There is no rebound and no guarding.  Musculoskeletal: She exhibits no edema.  Contracted lying on left hip  Lymphadenopathy:    She has no cervical adenopathy.  Neurological: She is alert.  Skin: Skin is warm. No rash noted. She is not diaphoretic. No erythema.   DATA REVIEW:   CBC  Recent Labs Lab 10/06/16 1510  WBC 10.2  HGB 11.7*  HCT 36.2  PLT 281    Chemistries   Recent Labs Lab 10/06/16 1510  NA 138  K 3.9  CL 102  CO2 29  GLUCOSE 103*  BUN 19  CREATININE 0.79  CALCIUM 9.8  AST 31  ALT 16  ALKPHOS 69  BILITOT 0.5    Cardiac Enzymes  Recent Labs Lab 10/06/16 1510  TROPONINI 0.03*    Microbiology Results  @  RADIOLOGY:  Ct Head Wo Contrast  Result Date: 10/06/2016 CLINICAL DATA:  Patient fell this morning. Nonverbal at baseline. Laceration to her right year. EXAM: CT HEAD WITHOUT CONTRAST TECHNIQUE: Contiguous axial images were obtained from the base of the skull through the vertex without intravenous contrast. COMPARISON:  None. FINDINGS: BRAIN: There is sulcal and ventricular prominence consistent with superficial and central atrophy. No intraparenchymal hemorrhage, mass effect nor midline shift. Periventricular and subcortical white matter hypodensities consistent with chronic small vessel ischemic disease are identified. No acute large vascular territory infarcts. No abnormal extra-axial fluid collections. Basal cisterns are not effaced and midline. VASCULAR: Moderate calcific atherosclerosis of the carotid siphons. SKULL: No skull fracture. No significant scalp soft tissue swelling. SINUSES/ORBITS: The mastoid air-cells are clear. The included paranasal sinuses are well-aerated.The included ocular globes and orbital contents are non-suspicious. OTHER: None. IMPRESSION: Chronic small vessel ischemic disease of periventricular white  matter. No acute intracranial abnormality. Cerebral atrophy. Electronically Signed   By: Tollie Eth M.D.   On: 10/06/2016 17:43   Dg Chest Port 1 View  Result Date: 10/06/2016 CLINICAL DATA:  Unwitnessed fall.  Pain. EXAM: PORTABLE CHEST 1 VIEW COMPARISON:  06/27/2016 FINDINGS: Chronic cardiomegaly. Background pattern of chronic pulmonary fibrosis. Slight increased bilateral upper lobe density could be seen with mild pneumonia. No dense consolidation or lobar collapse. No effusions. No acute bone finding. Chronic degenerative change of the shoulders. IMPRESSION: Chronic pulmonary fibrosis pattern. Slightly more density in the upper lobes which could represent mild pneumonia. Electronically Signed   By: Paulina Fusi M.D.   On: 10/06/2016 17:57   Dg Hip Unilat W Or Wo Pelvis 2-3 Views Right  Result Date: 10/06/2016 CLINICAL DATA:  82 year old female with un witnessed fall. Initial encounter. EXAM: DG HIP (WITH OR WITHOUT PELVIS) 2-3V RIGHT COMPARISON:  04/25/2016 abdominal films. FINDINGS: Post total right hip replacement. Findings suspicious for fracture of the right greater trochanter region with slight separation of fracture fragment. Foley catheter in place. Degenerative changes lumbar spine. IMPRESSION: Post total right hip replacement. Findings suspicious for fracture of the right greater trochanter region with slight separation  of fracture fragment. Electronically Signed   By: Lacy Duverney M.D.   On: 10/06/2016 18:00      Current Discharge Medication List    START taking these medications   Details  levofloxacin (LEVAQUIN) 25 MG/ML solution Take 30 mLs (750 mg total) by mouth daily. Qty: 75 mL, Refills: 0      CONTINUE these medications which have NOT CHANGED   Details  acetaminophen (TYLENOL) 500 MG tablet Take 500 mg by mouth every 6 (six) hours as needed.    amLODipine (NORVASC) 2.5 MG tablet Take 1 tablet (2.5 mg total) by mouth daily. Qty: 90 tablet, Refills: 0    aspirin EC  81 MG tablet Take 81 mg by mouth daily.    feeding supplement, ENSURE ENLIVE, (ENSURE ENLIVE) LIQD Take 237 mLs by mouth 2 (two) times daily between meals. Qty: 237 mL, Refills: 12    levETIRAcetam (KEPPRA) 750 MG tablet Take 1 tablet (750 mg total) by mouth 2 (two) times daily. Qty: 60 tablet, Refills: 5    mirtazapine (REMERON) 15 MG tablet Take 15 mg by mouth at bedtime.    Multiple Vitamin (MULTIVITAMIN WITH MINERALS) TABS tablet Take 1 tablet by mouth daily. Qty: 30 tablet, Refills: 5            Management plans discussed with the patient and family and they are in agreement. Stable for discharge  Home with hospice  Patient should follow up with hospice  CODE STATUS:     Code Status Orders        Start     Ordered   10/06/16 2133  Do not attempt resuscitation (DNR)  Continuous    Question Answer Comment  In the event of cardiac or respiratory ARREST Do not call a "code blue"   In the event of cardiac or respiratory ARREST Do not perform Intubation, CPR, defibrillation or ACLS   In the event of cardiac or respiratory ARREST Use medication by any route, position, wound care, and other measures to relive pain and suffering. May use oxygen, suction and manual treatment of airway obstruction as needed for comfort.      10/06/16 2132    Code Status History    Date Active Date Inactive Code Status Order ID Comments User Context   10/06/2016  6:17 PM 10/06/2016  9:32 PM DNR [id number]  Willy Eddy, MD ED   06/27/2016  5:00 PM 06/30/2016  8:13 PM DNR [id number]  Milagros Loll, MD ED   04/24/2016  3:29 PM 04/27/2016  7:12 PM Full Code [id number]  Auburn Bilberry, MD Inpatient   01/02/2016  8:03 PM 01/04/2016  5:18 PM Full Code [id number]  Houston Siren, MD Inpatient    Advance Directive Documentation     Most Recent Value  Type of Advance Directive  Out of facility DNR (pink MOST or yellow form)  Pre-existing out of facility DNR order (yellow form or pink MOST form)  -  "MOST"  Form in Place?  -      TOTAL TIME TAKING CARE OF THIS PATIENT: 37 minutes.    Note: This dictation was prepared with Dragon dictation along with smaller phrase technology. Any transcriptional errors that result from this process are unintentional.  Aryana Wonnacott M.D on 10/07/2016 at 12:07 PM  Between 7am to 6pm - Pager - [phone] After 6pm go to [email or web address] - Social research officer, government  [facility] [place]  Office  [phone]  CC: Primary care physician; Tommie Sams, DO

## 2016-10-07 NOTE — Care Management Note (Addendum)
Case Management Note  Patient Details  Name: Julia Huffman MRN: [id number] Date of Birth: [birth year]  Subjective/Objective:  Discharging today with PACE services. Established  PACE patient . Met with [name], case manager with PACE program. They currently are providing home health care with a sitter 9 hours per day. The plan will be for patient to return home with resumption of PACE. PACE plans to increase her daily hours and provide needed home health services. PACE plans to have a family meeting to discuss if hospice services are needed.                 Action/Plan: PACE patient, case closed.   Expected Discharge Date:  10/07/16               Expected Discharge Plan:  [place]  In-House Referral:     Discharge planning Services  CM Consult  Post Acute Care Choice:  Home Health Choice offered to:     DME Arranged:    DME Agency:     HH Arranged:    [place] Agency:  [place]  Status of Service:  Completed, signed off  If discussed at [place] of Stay Meetings, dates discussed:    Additional Comments:  Jolly Mango, RN 10/07/2016, 10:58 AM

## 2016-10-07 NOTE — Progress Notes (Signed)
[facility] - [place] at [facility]   PATIENT NAME: Julia Huffman    MR#:  [id number]  DATE OF BIRTH:  05-24-1935  SUBJECTIVE:  Patient mumbling this am  REVIEW OF SYSTEMS:    Review of Systems  Unable to perform ROS: Dementia    Tolerating Diet:yes      DRUG ALLERGIES:  No Known Allergies  VITALS:  Blood pressure 140/86, pulse (!) 102, temperature 98.9 F (37.2 C), temperature source Oral, resp. rate 18, height  (1.575 m), weight 43.6 kg (96 lb 1.6 oz), SpO2 100 %.  PHYSICAL EXAMINATION:   Physical Exam  Constitutional: No distress.  Frail thin  HENT:  Head: Normocephalic.  Eyes: No scleral icterus.  Neck: Neck supple. No JVD present. No tracheal deviation present.  Cardiovascular: Normal rate, regular rhythm and normal heart sounds.  Exam reveals no gallop and no friction rub.   No murmur heard. Pulmonary/Chest: Effort normal and breath sounds normal. No respiratory distress. She has no wheezes. She has no rales. She exhibits no tenderness.  Abdominal: Soft. Bowel sounds are normal. She exhibits no distension and no mass. There is no tenderness. There is no rebound and no guarding.  Musculoskeletal: She exhibits no edema.  Contracted lying on left hip  Lymphadenopathy:    She has no cervical adenopathy.  Neurological: She is alert.  Skin: Skin is warm. No rash noted. She is not diaphoretic. No erythema.      LABORATORY PANEL:   CBC  Recent Labs Lab 10/06/16 1510  WBC 10.2  HGB 11.7*  HCT 36.2  PLT 281   ------------------------------------------------------------------------------------------------------------------  Chemistries   Recent Labs Lab 10/06/16 1510  NA 138  K 3.9  CL 102  CO2 29  GLUCOSE 103*  BUN 19  CREATININE 0.79  CALCIUM 9.8  AST 31  ALT 16  ALKPHOS 69  BILITOT 0.5   ------------------------------------------------------------------------------------------------------------------  Cardiac  Enzymes  Recent Labs Lab 10/06/16 1510  TROPONINI 0.03*   ------------------------------------------------------------------------------------------------------------------  RADIOLOGY:  Ct Head Wo Contrast  Result Date: 10/06/2016 CLINICAL DATA:  Patient fell this morning. Nonverbal at baseline. Laceration to her right year. EXAM: CT HEAD WITHOUT CONTRAST TECHNIQUE: Contiguous axial images were obtained from the base of the skull through the vertex without intravenous contrast. COMPARISON:  None. FINDINGS: BRAIN: There is sulcal and ventricular prominence consistent with superficial and central atrophy. No intraparenchymal hemorrhage, mass effect nor midline shift. Periventricular and subcortical white matter hypodensities consistent with chronic small vessel ischemic disease are identified. No acute large vascular territory infarcts. No abnormal extra-axial fluid collections. Basal cisterns are not effaced and midline. VASCULAR: Moderate calcific atherosclerosis of the carotid siphons. SKULL: No skull fracture. No significant scalp soft tissue swelling. SINUSES/ORBITS: The mastoid air-cells are clear. The included paranasal sinuses are well-aerated.The included ocular globes and orbital contents are non-suspicious. OTHER: None. IMPRESSION: Chronic small vessel ischemic disease of periventricular white matter. No acute intracranial abnormality. Cerebral atrophy. Electronically Signed   By: Tollie Eth M.D.   On: 10/06/2016 17:43   Dg Chest Port 1 View  Result Date: 10/06/2016 CLINICAL DATA:  Unwitnessed fall.  Pain. EXAM: PORTABLE CHEST 1 VIEW COMPARISON:  06/27/2016 FINDINGS: Chronic cardiomegaly. Background pattern of chronic pulmonary fibrosis. Slight increased bilateral upper lobe density could be seen with mild pneumonia. No dense consolidation or lobar collapse. No effusions. No acute bone finding. Chronic degenerative change of the shoulders. IMPRESSION: Chronic pulmonary fibrosis pattern.  Slightly more density in the upper lobes which could represent  mild pneumonia. Electronically Signed   By: Paulina Fusi M.D.   On: 10/06/2016 17:57   Dg Hip Unilat W Or Wo Pelvis 2-3 Views Right  Result Date: 10/06/2016 CLINICAL DATA:  82 year old female with un witnessed fall. Initial encounter. EXAM: DG HIP (WITH OR WITHOUT PELVIS) 2-3V RIGHT COMPARISON:  04/25/2016 abdominal films. FINDINGS: Post total right hip replacement. Findings suspicious for fracture of the right greater trochanter region with slight separation of fracture fragment. Foley catheter in place. Degenerative changes lumbar spine. IMPRESSION: Post total right hip replacement. Findings suspicious for fracture of the right greater trochanter region with slight separation of fracture fragment. Electronically Signed   By: Lacy Duverney M.D.   On: 10/06/2016 18:00     ASSESSMENT AND PLAN:   82 year old female with past medical history of dementia, Pulmonary fibrosis, chronic respiratory failure, osteoporosis, chronic kidney disease stage III, osteoarthritis, hypertension who presented to the hospital due to a fall and also noted to have fever with pneumonia.   1. Sepsis: patient meets criteria given her fever, tachycardia due to PNA. Continue Rocephin and azithromycin and follow up on blood cultures.   2. Right hip fracture after mechanical fall: Family does not want to pursue aggressive intervention. Continue supportive care with pain control. Palliative care consultation.. She may be good candidate for hospice   3. Essential hypertension-blood pressure controlled  Continue Norvasc.  4. History of seizure disorder: continue Keppra.  5. Dementia with adult failure to thrive:  Continue feeding supplement Palliative care consult for disposition Continue Remeron    CODE STATUS: dnr  TOTAL TIME TAKING CARE OF THIS PATIENT: 28 minutes.     POSSIBLE D/C 1-2 days, DEPENDING ON CLINICAL CONDITION.   Julia Huffman,  Julia Huffman M.D on 10/07/2016 at 7:51 AM  Between 7am to 6pm - Pager - [phone] After 6pm go to [email or web address] - password [email or web address]  [facility]  Office  [phone]  CC: Primary care physician; Julia Sams, DO  Note: This dictation was prepared with Dragon dictation along with smaller phrase technology. Any transcriptional errors that result from this process are unintentional.

## 2016-10-07 NOTE — Consult Note (Signed)
Consultation Note Date: 10/07/2016   Patient Name: Julia Huffman Begin  DOB: 08/09/1934  MRN: [id number]  Age / Sex: 82 y.o., female  PCP: Tommie Sams, DO Referring Physician: Adrian Saran, MD  Reason for Consultation: Establishing goals of care  HPI/Patient Profile: 82 y.o. female  with past medical history of Pulmonary fibrosis, CKD 3, Vasculitis, Dementia, and Seizures who was admitted on 10/06/2016 after a fall.  She was found to have pneumonia and a hip fracture.   Clinical Assessment and Goals of Care:  I have reviewed medical records including EPIC notes, labs and imaging, received report from the medical team.  I then contacted her daughter Lanell Matar to establish and appointment time to discuss diagnosis prognosis, GOC, EOL wishes, disposition and options.  I introduced Palliative Medicine as specialized medical care for people living with serious illness. It focuses on providing relief from the symptoms and stress of a serious illness. The goal is to improve quality of life for both the patient and the family.  Lanell Matar is an [employer], consequently she is well informed about Hospice services.  Adela Lank tells me that her mother is a [facility] patient of Dr. Marliss Czar.  [facility] has their own "hospice like" support that Adela Lank intends to pursue.    Adela Lank tells me that her mother came to the hospital for pain management only.  She does not want aggressive measures or surgery.  Pain management is her primary concern.  She has very realistic expectations about her mother's life span.  "I'm amazed mamma's still here.  I just want her to be comfortable".    I gave Adela Lank my contact information and advised her that if she was not completely satisfied with the information/services thru [facility] then she should give me a call and we could talk further.  I answered several  questions for Jacquelyn regarding the patient's MRSA+ nasal swab and contact precautions.  I then examined Mrs. Hubers in order to ensure her pain is being controlled.   Primary Decision Maker:  NEXT OF KIN, HCPOA daughter Harvel Quale    SUMMARY OF RECOMMENDATIONS    Patient's daughter Harvel Quale is a Catering manager and is well informed about Hospice.  She is choosing to use the [facility] program services and will call me if other services would be helpful.  Code Status/Advance Care Planning:  DNR    Symptom Management:   Tylenol scheduled for pain  Morphine liquid PRN pain or SOB based on desire for comfort measures  Senna qhs for prevention of opioid induced constipation when morphine is used.  D/C telemetry.  Family interested only in comfort.  Additional Recommendations (Limitations, Scope, Preferences):  Avoid Hospitalization and Minimize Medications  Palliative Prophylaxis:   Delirium Protocol  Prognosis:   Weeks given pneumonia, hip fracture, dementia, pulm fibrosis, lack of motivation or desire to eat.  Discharge Planning: To Jacquelyn's home with [facility] services.      Primary Diagnoses: Present on Admission: . Pneumonia   I have reviewed the medical record, interviewed the  patient and family, and examined the patient. The following aspects are pertinent.  Past Medical History:  Diagnosis Date  . Arthritis   . Chicken pox   . Chronic kidney disease    Stage III  . History of vasculitis    History of Microscopic polyangitis  . Hypertension   . Osteoporosis   . Pulmonary fibrosis [facility])    Social History   Social History  . Marital status: Widowed    Spouse name: N/A  . Number of children: N/A  . Years of education: N/A   Social History Main Topics  . Smoking status: Former Smoker    Packs/day: 0.25    Years: 5.00    Types: Cigarettes    Quit date: 01/22/1991  . Smokeless tobacco: Never Used  . Alcohol use No  . Drug use: No  . Sexual  activity: Not Asked   Other Topics Concern  . None   Social History Narrative  . None   Family History  Problem Relation Age of Onset  . Heart attack Mother   . Arthritis Mother   . Heart disease Mother   . Hypertension Mother    Scheduled Meds: . amLODipine  2.5 mg Oral Daily  . aspirin EC  81 mg Oral Daily  . azithromycin  250 mg Oral Daily  . cefTRIAXone (ROCEPHIN)  IV  1 g Intravenous Q24H  . Chlorhexidine Gluconate Cloth  6 each Topical Q0600  . enoxaparin (LOVENOX) injection  40 mg Subcutaneous Q24H  . feeding supplement (ENSURE ENLIVE)  237 mL Oral BID BM  . levETIRAcetam  750 mg Oral BID  . mirtazapine  15 mg Oral QHS  . multivitamin with minerals  1 tablet Oral Daily  . mupirocin ointment  1 application Nasal BID   Continuous Infusions: PRN Meds:.acetaminophen **OR** acetaminophen, ketorolac, morphine injection, ondansetron **OR** ondansetron (ZOFRAN) IV No Known Allergies Review of Systems patient unable to provide.  Physical Exam  Frail elderly demented female who responds "Thank you" to my questions CV tachycardic Resp no distress Abdomen soft, nd Ext no edema in LE  Vital Signs: BP 140/86 (BP Location: Right Arm)   Pulse (!) 102   Temp 98.9 F (37.2 C) (Oral)   Resp 18   Ht  (1.575 m)   Wt 43.6 kg (96 lb 1.6 oz)   SpO2 100%   BMI 17.58 kg/m  Pain Assessment: PAINAD   Pain Score: Asleep   SpO2: SpO2: 100 % O2 Device:SpO2: 100 % O2 Flow Rate: .O2 Flow Rate (L/min): 2 L/min  IO: Intake/output summary:  Intake/Output Summary (Last 24 hours) at 10/07/16 1051 Last data filed at 10/07/16 0409  Gross per 24 hour  Intake             1750 ml  Output             2150 ml  Net             -400 ml    LBM:   Baseline Weight: Weight: 46.3 kg (102 lb 1.2 oz) Most recent weight: Weight: 43.6 kg (96 lb 1.6 oz)     Palliative Assessment/Data:   Flowsheet Rows     Most Recent Value  Intake Tab  Referral Department  Hospitalist  Unit at Time  of Referral  Med/Surg Unit  Palliative Care Primary Diagnosis  Neurology  Date Notified  10/07/16  Palliative Care Type  Return patient Palliative Care  Reason for referral  Clarify Goals of Care  Date of Admission  10/06/16  Date first seen by Palliative Care  10/07/16  # of days Palliative referral response time  0 Day(s)  # of days IP prior to Palliative referral  1  Clinical Assessment  Psychosocial & Spiritual Assessment  Palliative Care Outcomes  Patient/Family meeting held?  Yes  Who was at the meeting?  Talked with HCPOA on the phone  Palliative Care Outcomes  Clarified goals of care      Time In: 10:30 Time Out: 11:00 Time Total: 30 min. Greater than 50%  of this time was spent counseling and coordinating care related to the above assessment and plan.  Signed by: Algis Downs, PA-C Palliative Medicine Pager: [phone]  Please contact Palliative Medicine Team phone at [phone] for questions and concerns.  For individual provider: See Loretha Stapler

## 2016-10-07 NOTE — Progress Notes (Signed)
Patient is resting comfortably in bed. Spoke with daughter, Adela Lank on phone and will be arranging EMS transport for patient to go home. Foley discontinued. PIV discontinued. [facility] notified of discharge home. Vital signs stable.

## 2016-10-08 LAB — URINE CULTURE: CULTURE: NO GROWTH

## 2016-10-09 ENCOUNTER — Telehealth: Payer: Self-pay | Admitting: Family Medicine

## 2016-10-09 NOTE — Telephone Encounter (Signed)
Patient dis charged home with hospice , no TCM.

## 2016-10-11 LAB — CULTURE, BLOOD (ROUTINE X 2)
CULTURE: NO GROWTH
Culture: NO GROWTH
Special Requests: ADEQUATE

## 2016-10-22 ENCOUNTER — Ambulatory Visit: Payer: Medicare Other | Admitting: Family Medicine

## 2016-10-27 DEATH — deceased

## 2018-06-19 IMAGING — MR MR HEAD W/O CM
11 series · 48 of 48 positions shown · non-contrast
Comparison: Head CT from earlier today

CLINICAL DATA: Unresponsive.  Seizure.

EXAM:
MRI HEAD WITHOUT CONTRAST
TECHNIQUE: Multiplanar, multiecho pulse sequences of the brain and surrounding
structures were obtained without intravenous contrast.

[Series 2: T1 · sagittal · 5.0mm · 0.45mm/px · 3 of 25 slices shown (1 of 2)]
[im 1/25]
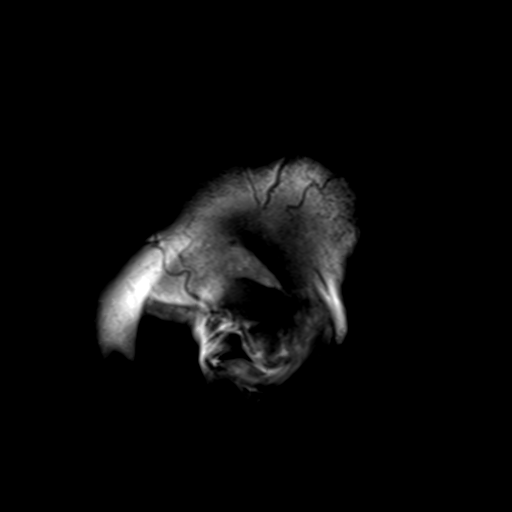
[im 13/25]
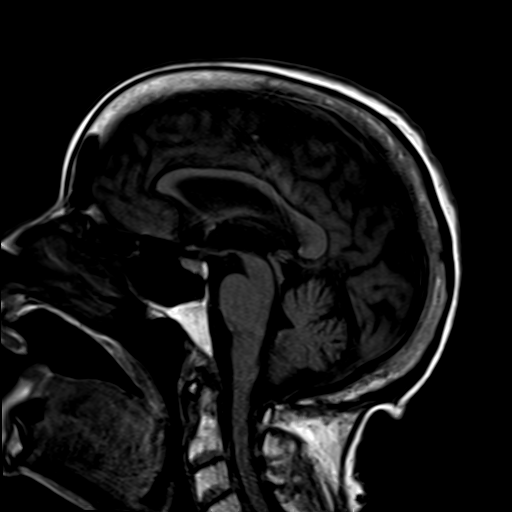
[im 25/25]
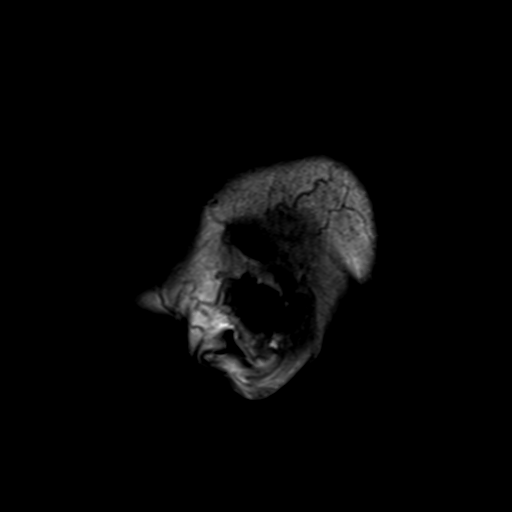

[Series 7: T2 · axial · 5.0mm · 0.60mm/px · z∈[-96,+57]mm · 3 of 25 slices shown (1 of 4)]
[im 1/25]
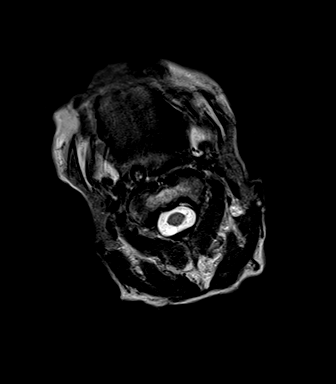
[im 13/25]
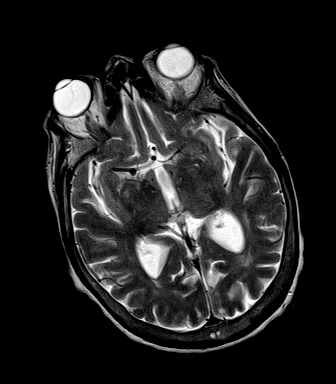
[im 25/25]
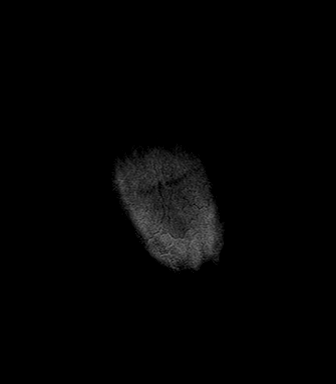

[Series 8: FLAIR · axial · 5.0mm · 0.45mm/px · z∈[-96,+57]mm · 3 of 25 slices shown]
[im 1/25]
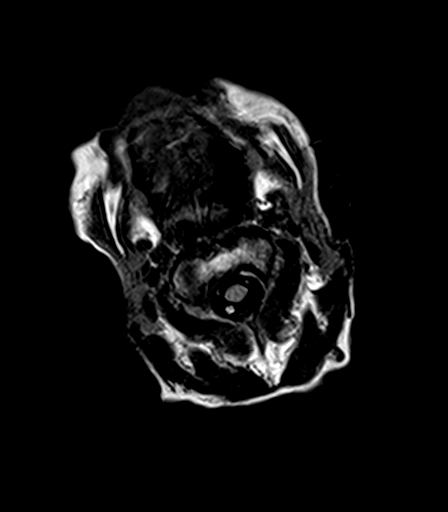
[im 13/25]
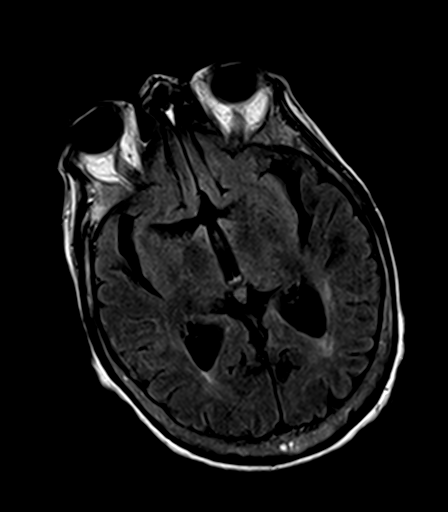
[im 25/25]
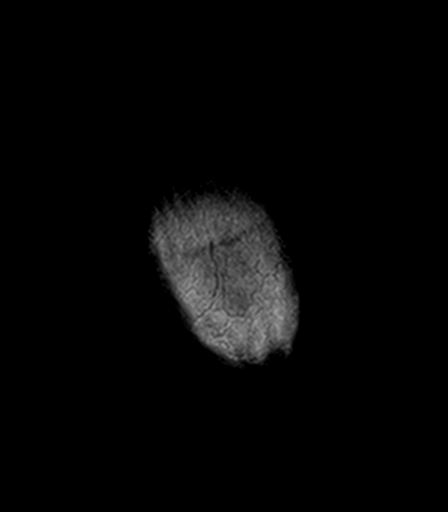

[Series 9: T2 · axial · 5.0mm · 0.45mm/px · z∈[-96,+57]mm · 3 of 25 slices shown (2 of 4)]
[im 1/25]
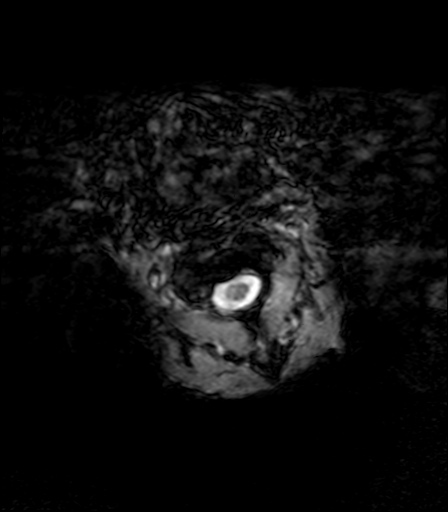
[im 13/25]
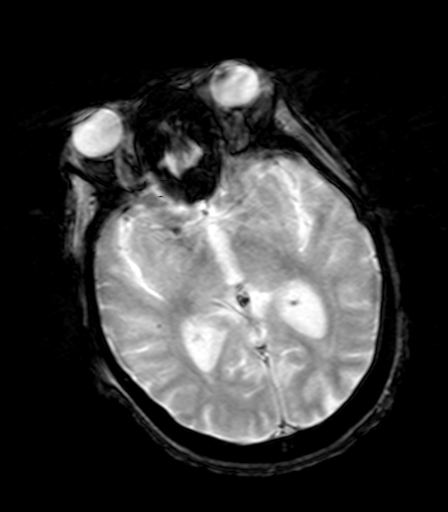
[im 25/25]
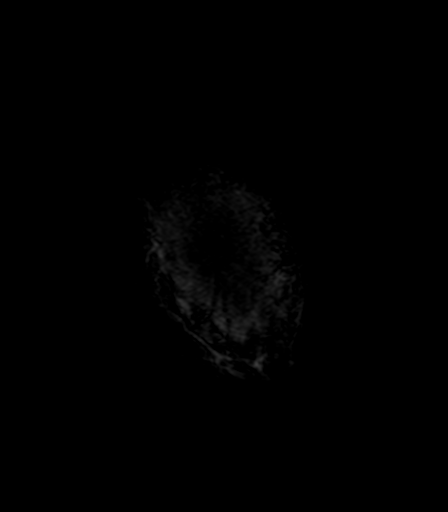

[Series 10: T1 · axial · 3.0mm · 1.00mm/px · z∈[-104,+70]mm · 7 of 60 slices shown (2 of 2)]
[im 1/60]
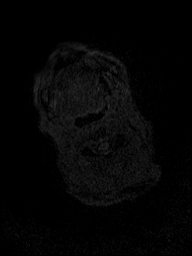
[im 10/60]
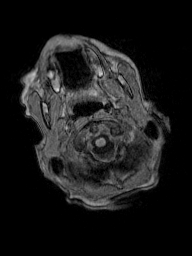
[im 20/60]
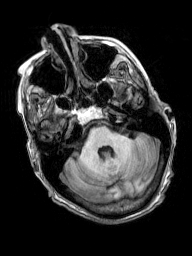
[im 30/60]
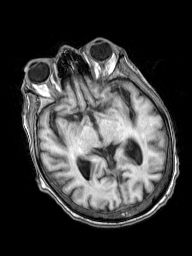
[im 40/60]
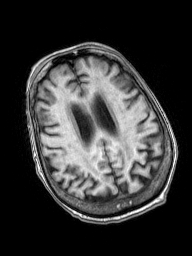
[im 50/60]
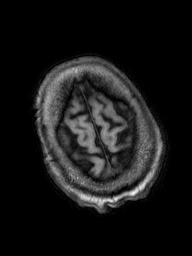
[im 60/60]
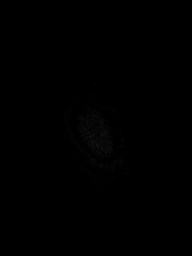

[Series 11: T2 · coronal · 5.0mm · 0.49mm/px · 3 of 27 slices shown (3 of 4)]
[im 1/27]
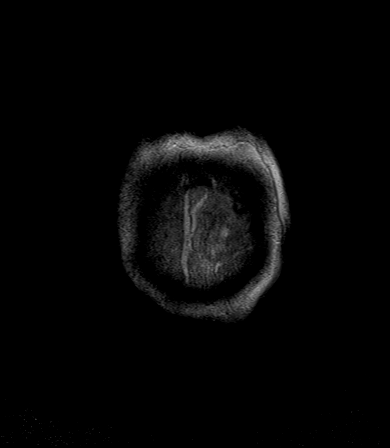
[im 14/27]
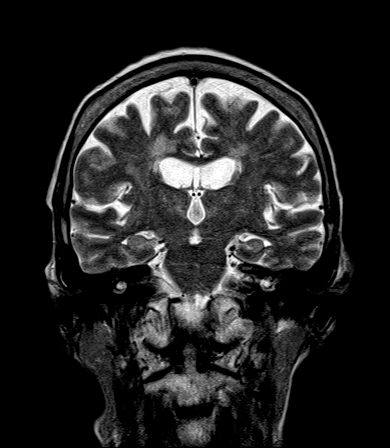
[im 27/27]
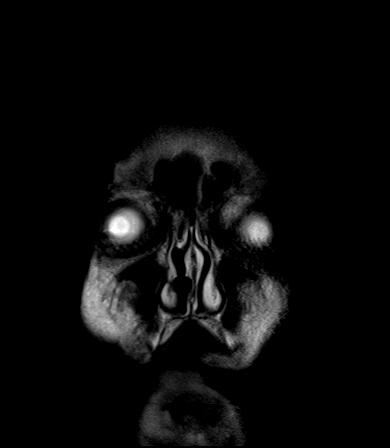

[Series 12: T2 · coronal · 4.0mm · 0.60mm/px · 4 of 31 slices shown (4 of 4)]
[im 1/31]
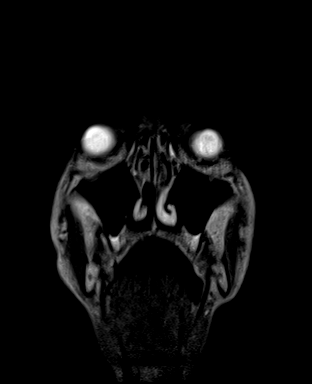
[im 11/31]
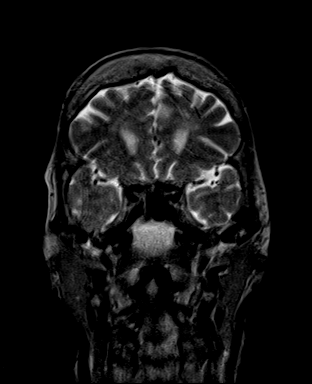
[im 21/31]
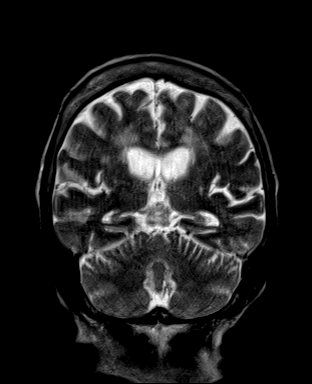
[im 31/31]
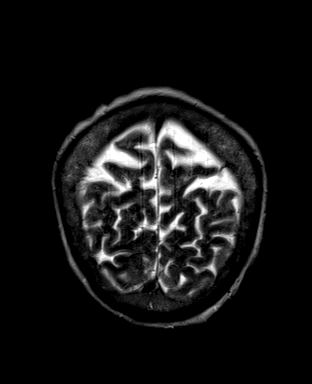

[Series 100: DWI · axial · 3.0mm · 1.80mm/px · z∈[-98,+61]mm · 6 of 54 slices shown (1 of 2)]
[im 1/54]
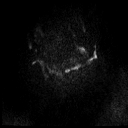
[im 11/54]
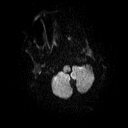
[im 22/54]
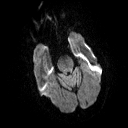
[im 32/54]
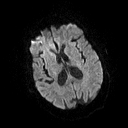
[im 43/54]
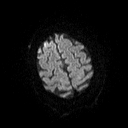
[im 54/54]
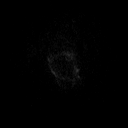

[Series 101: ADC · axial · 3.0mm · 1.80mm/px · z∈[-98,+61]mm · 6 of 55 slices shown (1 of 2)]
[im 1/55]
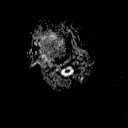
[im 11/55]
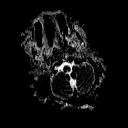
[im 22/55]
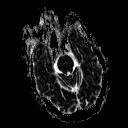
[im 33/55]
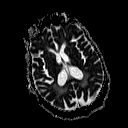
[im 44/55]
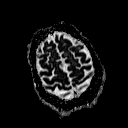
[im 55/55]
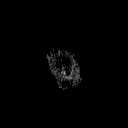

[Series 102: DWI · coronal · 3.0mm · 1.80mm/px · 5 of 40 slices shown (2 of 2)]
[im 1/40]
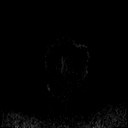
[im 10/40]
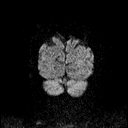
[im 20/40]
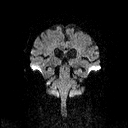
[im 30/40]
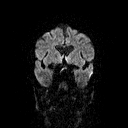
[im 40/40]
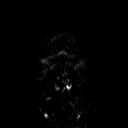

[Series 103: ADC · coronal · 3.0mm · 1.80mm/px · 5 of 45 slices shown (2 of 2)]
[im 1/45]
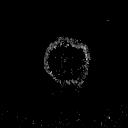
[im 12/45]
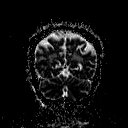
[im 23/45]
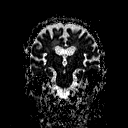
[im 34/45]
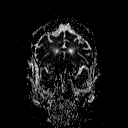
[im 45/45]
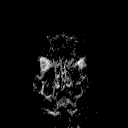

[48 of 48 positions shown; findings below may reference images not displayed]

FINDINGS: Brain: No acute infarction, hemorrhage, hydrocephalus, extra-axial
collection or mass lesion.

There is chronic cerebral microvascular ischemia that is moderate
for age. Age congruent cerebral volume loss.

No generalized chronic blood products. 2 foci of susceptibility
along the inferior right frontal lobe could be volume averaging of
the skullbase.

No cortical finding to explain seizure.

Vascular: Normal flow voids.

Skull and upper cervical spine: Cervical facet arthropathy. No focal
marrow lesion.

Sinuses/Orbits: Bilateral cataract resection.  No acute finding
IMPRESSION: 1. No acute finding.  No cortical finding to explain seizure.
2. Moderate chronic microvascular disease.

## 2018-06-20 IMAGING — NM NM GI BLOOD LOSS
2 series · 12 of 12 positions shown · non-contrast
Comparison: None.

CLINICAL DATA: Blood in stool, history of hemorrhoids and
diverticulitis

EXAM:
NUCLEAR MEDICINE GASTROINTESTINAL BLEEDING SCAN
TECHNIQUE: Sequential abdominal images were obtained following intravenous
administration of Dc-FFm labeled red blood cells.
RADIOPHARMACEUTICALS:  24.1 mCi Dc-FFm in-vitro labeled red cells.

[Series 1000: gi bleed hour 2 · 4.80mm/px · 6 of 60 frames shown]
[frame 6/60]
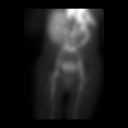
[frame 16/60]
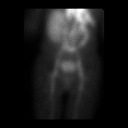
[frame 26/60]
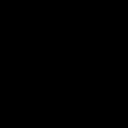
[frame 36/60]
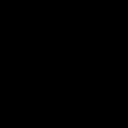
[frame 46/60]
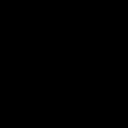
[frame 56/60]
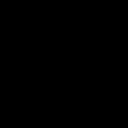

[Series 1000: hour 1 gi bleed · 4.80mm/px · 6 of 60 frames shown]
[frame 6/60]
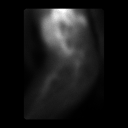
[frame 16/60]
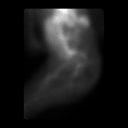
[frame 26/60]
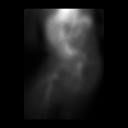
[frame 36/60]
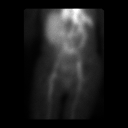
[frame 46/60]
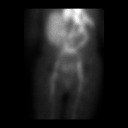
[frame 56/60]
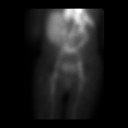

[12 of 12 positions shown; findings below may reference images not displayed]

FINDINGS: The patient initially started on her side, with bar moving across
for body multiple times.

By minute 26, with arms by her side and essentially in the supine
position, there is curvilinear uptake along the left mid abdomen.
However, this essentially remains unchanged for the remainder of the
study (additional 15 minutes).

As such, given lack of progression, this is favored to reflect
uptake within a distended stomach rather than an active GI bleed.
IMPRESSION: Suspected radiotracer uptake within a distended stomach in the left
mid abdomen.

No findings specific for active GI bleed.

## 2018-06-20 IMAGING — DX DG CHEST 1V
1 series · 1 of 1 positions shown · non-contrast
Comparison: 01/02/2016

CLINICAL DATA: Pneumonia

EXAM:
CHEST 1 VIEW

[chest ap]
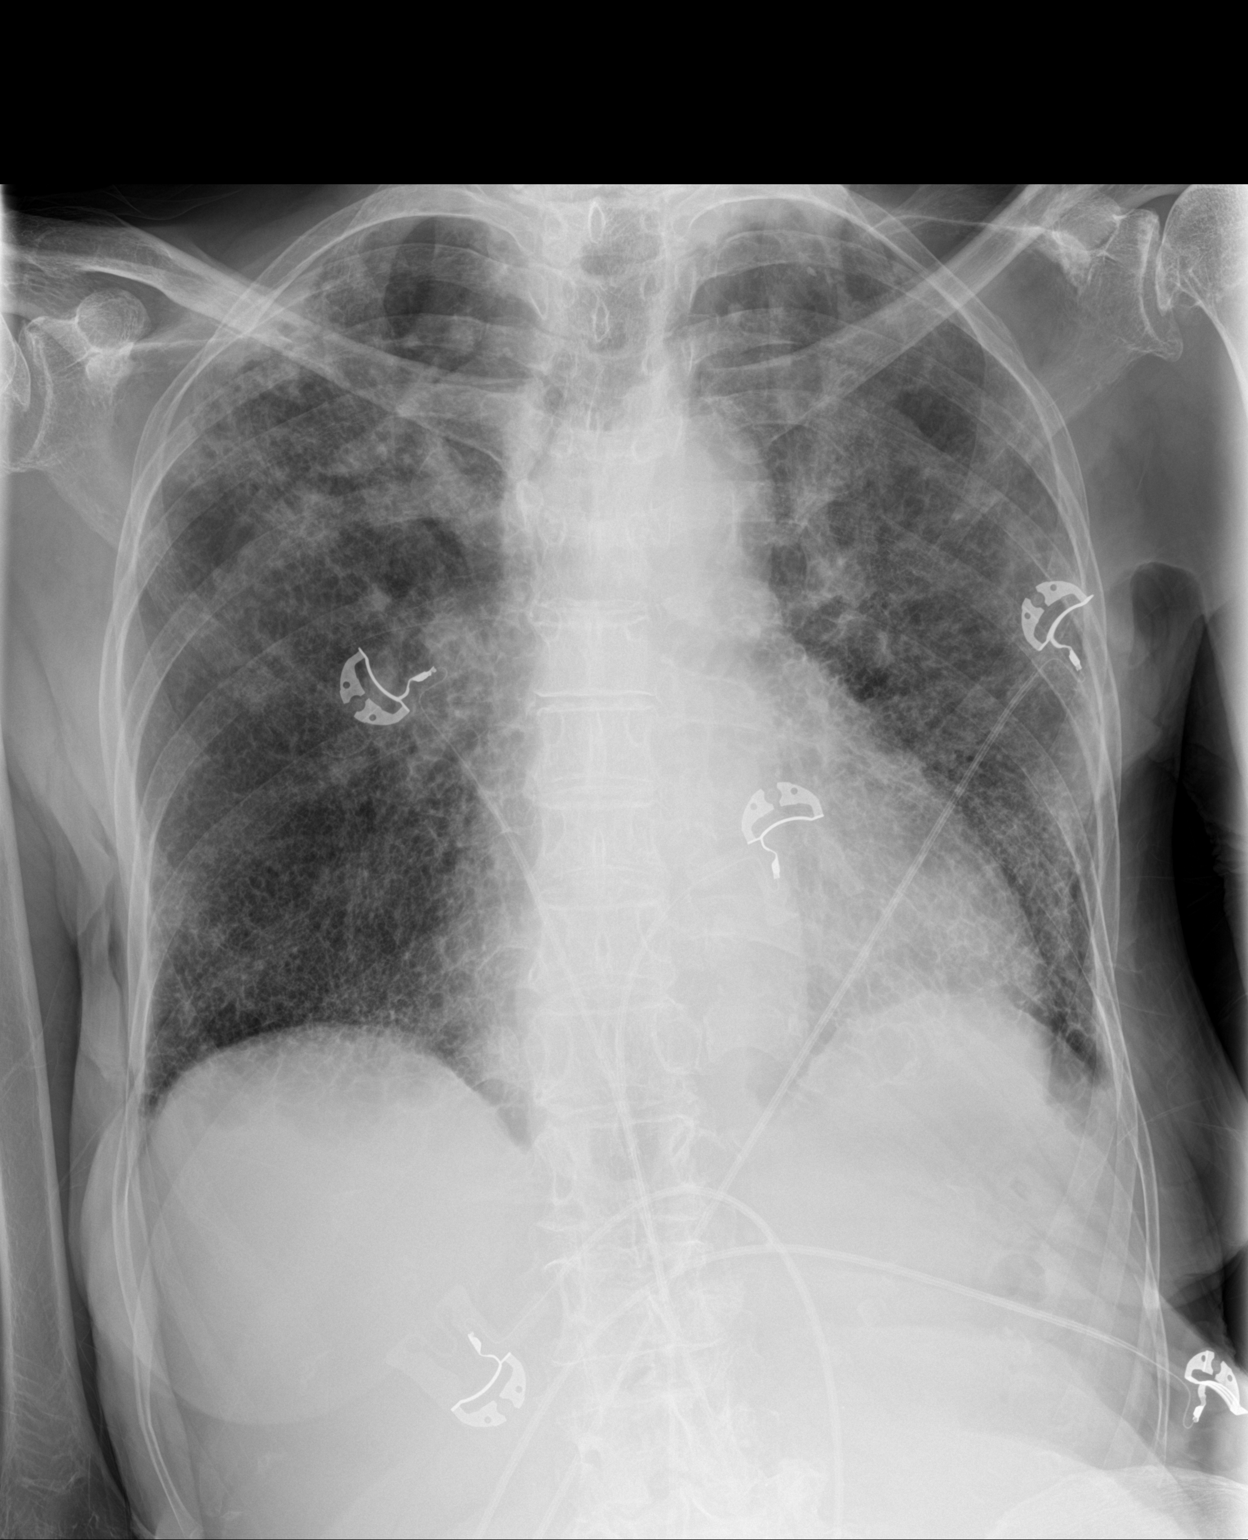

[1 of 1 positions shown; findings below may reference images not displayed]

FINDINGS: Coarse interstitial markings throughout both lungs are unchanged
compatible with pulmonary fibrosis which is advanced. No
superimposed pneumonia. Negative for heart failure or effusion.
Apical scarring bilaterally.
IMPRESSION: Diffuse pulmonary fibrosis stable.  No superimposed pneumonia.
